# Patient Record
Sex: Male | Born: 1950 | Race: White | Hispanic: No | Marital: Married | Smoking: Former smoker
Health system: Southern US, Community
[De-identification: ages and names within clinical notes are randomized; demographics above are authoritative.]

## PROBLEM LIST (undated history)

## (undated) DIAGNOSIS — I219 Acute myocardial infarction, unspecified: Secondary | ICD-10-CM

## (undated) HISTORY — DX: Acute myocardial infarction, unspecified: I21.9

---

## 2013-09-24 ENCOUNTER — Encounter (HOSPITAL_BASED_OUTPATIENT_CLINIC_OR_DEPARTMENT_OTHER): Payer: Self-pay | Admitting: Emergency Medicine

## 2013-09-24 ENCOUNTER — Inpatient Hospital Stay (HOSPITAL_BASED_OUTPATIENT_CLINIC_OR_DEPARTMENT_OTHER)
Admission: EM | Admit: 2013-09-24 | Discharge: 2013-09-29 | DRG: 234 | Disposition: A | Discharge status: Home / Self Care | Payer: BC Managed Care – PPO | Attending: Thoracic Surgery (Cardiothoracic Vascular Surgery) | Admitting: Thoracic Surgery (Cardiothoracic Vascular Surgery)

## 2013-09-24 DIAGNOSIS — E8779 Other fluid overload: Secondary | ICD-10-CM | POA: Diagnosis not present

## 2013-09-24 DIAGNOSIS — Z8249 Family history of ischemic heart disease and other diseases of the circulatory system: Secondary | ICD-10-CM

## 2013-09-24 DIAGNOSIS — I25709 Atherosclerosis of coronary artery bypass graft(s), unspecified, with unspecified angina pectoris: Secondary | ICD-10-CM | POA: Diagnosis present

## 2013-09-24 DIAGNOSIS — R7309 Other abnormal glucose: Secondary | ICD-10-CM | POA: Diagnosis present

## 2013-09-24 DIAGNOSIS — I214 Non-ST elevation (NSTEMI) myocardial infarction: Principal | ICD-10-CM | POA: Diagnosis present

## 2013-09-24 DIAGNOSIS — I200 Unstable angina: Secondary | ICD-10-CM

## 2013-09-24 DIAGNOSIS — D62 Acute posthemorrhagic anemia: Secondary | ICD-10-CM | POA: Diagnosis not present

## 2013-09-24 DIAGNOSIS — I2510 Atherosclerotic heart disease of native coronary artery without angina pectoris: Secondary | ICD-10-CM | POA: Diagnosis present

## 2013-09-24 LAB — BASIC METABOLIC PANEL
BUN: 19 mg/dL (ref 6–23)
CO2: 25 mEq/L (ref 19–32)
Calcium: 9.4 mg/dL (ref 8.4–10.5)
Chloride: 102 mEq/L (ref 96–112)
Creatinine, Ser: 1 mg/dL (ref 0.50–1.35)
GFR calc Af Amer: >90 mL/min (ref >90)
GFR, EST NON AFRICAN AMERICAN: 78 mL/min — AB (ref >90)
GLUCOSE: 134 mg/dL — AB (ref 70–99)
POTASSIUM: 4 meq/L (ref 3.7–5.3)
Sodium: 141 mEq/L (ref 137–147)

## 2013-09-24 LAB — CBC
HCT: 48.8 % (ref 39.0–52.0)
HEMOGLOBIN: 17.4 g/dL — AB (ref 13.0–17.0)
MCH: 31.8 pg (ref 26.0–34.0)
MCHC: 35.7 g/dL (ref 30.0–36.0)
MCV: 89.2 fL (ref 78.0–100.0)
Platelets: 273 10*3/uL (ref 150–400)
RBC: 5.47 MIL/uL (ref 4.22–5.81)
RDW: 14 % (ref 11.5–15.5)
WBC: 9.4 10*3/uL (ref 4.0–10.5)

## 2013-09-24 LAB — TROPONIN I: Troponin I: <0.3 ng/mL (ref <0.30)

## 2013-09-24 MED ORDER — ASPIRIN 81 MG PO CHEW
324.0000 mg | CHEWABLE_TABLET | Freq: Once | ORAL | Status: AC
  Administered 2013-09-24: 324 mg via ORAL
  Filled 2013-09-24: qty 4

## 2013-09-24 NOTE — ED Provider Notes (Signed)
CSN: [id number]     Arrival date & time 09/24/13  2219 History   First MD Initiated Contact with Patient 09/24/13 2324     Chief Complaint  Patient presents with  . Chest Pain     (Consider location/radiation/quality/duration/timing/severity/associated sxs/prior Treatment) HPI This is a 63 year old male who experienced the sudden onset of chest discomfort at about 10 PM today. The discomfort was located in the vicinity of his Adam's apple and is described as feeling like he had a lump of food in his throat. He was not eating at the time. The discomfort worsened and and was 7/10 at its worst. He has some radiation to the back of his upper arms bilaterally. He became very anxious, had some nausea, dyspnea and clamminess. His symptoms were resolving by the time he arrived in the ED and he is pain-free right now. He has had some similar, but milder, symptoms over the past several days.  History reviewed. No pertinent past medical history. History reviewed. No pertinent past surgical history. No family history on file. History  Substance Use Topics  . Smoking status: Never Smoker   . Smokeless tobacco: Not on file  . Alcohol Use: No    Review of Systems  All other systems reviewed and are negative.    Allergies  Review of patient's allergies indicates no known allergies.  Home Medications  No current outpatient prescriptions on file. BP 167/96  Pulse 89  Temp(Src) 97.7 F (36.5 C) (Oral)  Resp 18  Ht 5\' 10"  (1.778 m)  Wt 190 lb (86.183 kg)  BMI 27.26 kg/m2  SpO2 99%  Physical Exam General: Well-developed, well-nourished male in no acute distress; appearance consistent with age of record HENT: normocephalic; atraumatic Eyes: pupils equal, round and reactive to light; extraocular muscles intact Neck: supple Heart: regular rate and rhythm; no murmurs, rubs or gallops Lungs: clear to auscultation bilaterally Abdomen: soft; nondistended; nontender; bowel sounds  present Extremities: No deformity; full range of motion; pulses normal Neurologic: Awake, alert and oriented; motor function intact in all extremities and symmetric; no facial droop Skin: Warm and dry Psychiatric: Normal mood and affect    ED Course  Procedures (including critical care time)    MDM  EKG Interpretation: (on arrival, pain still present)  Date & Time: 09/24/2013 10:28 PM  Rate: 85  Rhythm: normal sinus rhythm  QRS Axis: normal  Intervals: normal  ST/T Wave abnormalities: ST elevations laterally and ST depressions inferiorly  Conduction Disutrbances:none  Narrative Interpretation:   Old EKG Reviewed: none available  EKG Interpretation: (pain-free)  Date & Time: 09/24/2013 11:39 PM  Rate: 90  Rhythm: normal sinus rhythm  QRS Axis: normal  Intervals: normal  ST/T Wave abnormalities: normal  Conduction Disutrbances:none  Narrative Interpretation:   Old EKG Reviewed: ST depressions have resolved     Nursing notes and vitals signs, including pulse oximetry, reviewed.  Summary of this visit's results, reviewed by myself:  Labs:  Results for orders placed during the hospital encounter of 09/24/13 (from the past 24 hour(s))  CBC     Status: Abnormal   Collection Time    09/24/13 10:44 PM      Result Value Ref Range   WBC 9.4  4.0 - 10.5 K/uL   RBC 5.47  4.22 - 5.81 MIL/uL   Hemoglobin 17.4 (*) 13.0 - 17.0 g/dL   HCT 48.8  39.0 - 52.0 %   MCV 89.2  78.0 - 100.0 fL   MCH 31.8  26.0 -  34.0 pg   MCHC 35.7  30.0 - 36.0 g/dL   RDW 14.0  11.5 - 15.5 %   Platelets 273  150 - 400 K/uL  BASIC METABOLIC PANEL     Status: Abnormal   Collection Time    09/24/13 10:44 PM      Result Value Ref Range   Sodium 141  137 - 147 mEq/L   Potassium 4.0  3.7 - 5.3 mEq/L   Chloride 102  96 - 112 mEq/L   CO2 25  19 - 32 mEq/L   Glucose, Bld 134 (*) 70 - 99 mg/dL   BUN 19  6 - 23 mg/dL   Creatinine, Ser 1.00  0.50 - 1.35 mg/dL   Calcium 9.4  8.4 - 10.5 mg/dL   GFR calc  non Af Amer 78 (*) >90 mL/min   GFR calc Af Amer >90  >90 mL/min  TROPONIN I     Status: None   Collection Time    09/24/13 10:44 PM      Result Value Ref Range   Troponin I <0.30  <0.30 ng/mL   Dr. Doylene Canard accepts patient for transfer to [facility]. He requests unfractionated heparin be initiated prior to transport.     Wynetta Fines, MD 09/25/13 [phone]

## 2013-09-24 NOTE — ED Notes (Signed)
Chest pain for 10 minutes. Had the same type feeling 3 days ago. Pale, sob, and diaphoretic at time of onset.

## 2013-09-25 ENCOUNTER — Encounter (HOSPITAL_COMMUNITY): Payer: Self-pay | Admitting: *Deleted

## 2013-09-25 ENCOUNTER — Encounter (HOSPITAL_COMMUNITY): Payer: BC Managed Care – PPO | Admitting: Certified Registered"

## 2013-09-25 ENCOUNTER — Inpatient Hospital Stay (HOSPITAL_COMMUNITY): Payer: BC Managed Care – PPO | Admitting: Certified Registered"

## 2013-09-25 ENCOUNTER — Inpatient Hospital Stay (HOSPITAL_COMMUNITY): Payer: BC Managed Care – PPO

## 2013-09-25 ENCOUNTER — Encounter (HOSPITAL_COMMUNITY)
Admission: EM | Disposition: A | Discharge status: Home / Self Care | Payer: BC Managed Care – PPO | Source: Home / Self Care | Attending: Thoracic Surgery (Cardiothoracic Vascular Surgery)

## 2013-09-25 DIAGNOSIS — I25709 Atherosclerosis of coronary artery bypass graft(s), unspecified, with unspecified angina pectoris: Secondary | ICD-10-CM | POA: Diagnosis present

## 2013-09-25 DIAGNOSIS — R079 Chest pain, unspecified: Secondary | ICD-10-CM

## 2013-09-25 DIAGNOSIS — I2510 Atherosclerotic heart disease of native coronary artery without angina pectoris: Secondary | ICD-10-CM

## 2013-09-25 HISTORY — PX: INTRAOPERATIVE TRANSESOPHAGEAL ECHOCARDIOGRAM: SHX5062

## 2013-09-25 HISTORY — PX: LEFT HEART CATHETERIZATION WITH CORONARY ANGIOGRAM: SHX5451

## 2013-09-25 HISTORY — PX: CORONARY ARTERY BYPASS GRAFT: SHX141

## 2013-09-25 LAB — LIPID PANEL
CHOLESTEROL: 223 mg/dL — AB (ref 0–200)
HDL: 43 mg/dL (ref >39)
LDL Cholesterol: 157 mg/dL — ABNORMAL HIGH (ref 0–99)
TRIGLYCERIDES: 115 mg/dL (ref <150)
Total CHOL/HDL Ratio: 5.2 RATIO
VLDL: 23 mg/dL (ref 0–40)

## 2013-09-25 LAB — POCT I-STAT 3, ART BLOOD GAS (G3+)
Acid-base deficit: 1 mmol/L (ref 0.0–2.0)
Acid-base deficit: 1 mmol/L (ref 0.0–2.0)
BICARBONATE: 23.8 meq/L (ref 20.0–24.0)
Bicarbonate: 22.8 mEq/L (ref 20.0–24.0)
O2 Saturation: 100 %
O2 Saturation: 95 %
PH ART: 7.412 (ref 7.350–7.450)
PH ART: 7.434 (ref 7.350–7.450)
TCO2: 25 mmol/L (ref 0–100)
TCO2: 24 mmol/L (ref 0–100)
pCO2 arterial: 37.4 mmHg (ref 35.0–45.0)
pCO2 arterial: 33.5 mmHg — ABNORMAL LOW (ref 35.0–45.0)
pO2, Arterial: 258 mmHg — ABNORMAL HIGH (ref 80.0–100.0)
pO2, Arterial: 65 mmHg — ABNORMAL LOW (ref 80.0–100.0)

## 2013-09-25 LAB — TROPONIN I: TROPONIN I: 1.32 ng/mL — AB (ref <0.30)

## 2013-09-25 LAB — CBC
HCT: 39.8 % (ref 39.0–52.0)
HEMOGLOBIN: 14.3 g/dL (ref 13.0–17.0)
MCH: 31.8 pg (ref 26.0–34.0)
MCHC: 35.9 g/dL (ref 30.0–36.0)
MCV: 88.4 fL (ref 78.0–100.0)
Platelets: 163 10*3/uL (ref 150–400)
RBC: 4.5 MIL/uL (ref 4.22–5.81)
RDW: 13.7 % (ref 11.5–15.5)
WBC: 21.2 10*3/uL — ABNORMAL HIGH (ref 4.0–10.5)

## 2013-09-25 LAB — POCT I-STAT 4, (NA,K, GLUC, HGB,HCT)
GLUCOSE: 108 mg/dL — AB (ref 70–99)
GLUCOSE: 109 mg/dL — AB (ref 70–99)
GLUCOSE: 114 mg/dL — AB (ref 70–99)
Glucose, Bld: 110 mg/dL — ABNORMAL HIGH (ref 70–99)
Glucose, Bld: 125 mg/dL — ABNORMAL HIGH (ref 70–99)
Glucose, Bld: 133 mg/dL — ABNORMAL HIGH (ref 70–99)
HCT: 30 % — ABNORMAL LOW (ref 39.0–52.0)
HCT: 39 % (ref 39.0–52.0)
HCT: 29 % — ABNORMAL LOW (ref 39.0–52.0)
HCT: 40 % (ref 39.0–52.0)
HEMATOCRIT: 42 % (ref 39.0–52.0)
HEMATOCRIT: 31 % — AB (ref 39.0–52.0)
HEMOGLOBIN: 10.2 g/dL — AB (ref 13.0–17.0)
HEMOGLOBIN: 13.6 g/dL (ref 13.0–17.0)
Hemoglobin: 14.3 g/dL (ref 13.0–17.0)
Hemoglobin: 13.3 g/dL (ref 13.0–17.0)
Hemoglobin: 10.5 g/dL — ABNORMAL LOW (ref 13.0–17.0)
Hemoglobin: 9.9 g/dL — ABNORMAL LOW (ref 13.0–17.0)
POTASSIUM: 3.7 meq/L (ref 3.7–5.3)
Potassium: 3.9 mEq/L (ref 3.7–5.3)
Potassium: 3.6 mEq/L — ABNORMAL LOW (ref 3.7–5.3)
Potassium: 4.1 mEq/L (ref 3.7–5.3)
Potassium: 4.8 mEq/L (ref 3.7–5.3)
Potassium: 3.9 mEq/L (ref 3.7–5.3)
SODIUM: 140 meq/L (ref 137–147)
SODIUM: 139 meq/L (ref 137–147)
SODIUM: 139 meq/L (ref 137–147)
Sodium: 135 mEq/L — ABNORMAL LOW (ref 137–147)
Sodium: 141 mEq/L (ref 137–147)
Sodium: 138 mEq/L (ref 137–147)

## 2013-09-25 LAB — HEMOGLOBIN AND HEMATOCRIT, BLOOD
HCT: 31.2 % — ABNORMAL LOW (ref 39.0–52.0)
Hemoglobin: 11.4 g/dL — ABNORMAL LOW (ref 13.0–17.0)

## 2013-09-25 LAB — CBC WITH DIFFERENTIAL/PLATELET
Basophils Absolute: 0.1 10*3/uL (ref 0.0–0.1)
Basophils Relative: 1 % (ref 0–1)
EOS ABS: 0.2 10*3/uL (ref 0.0–0.7)
Eosinophils Relative: 2 % (ref 0–5)
HCT: 46.6 % (ref 39.0–52.0)
Hemoglobin: 16.7 g/dL (ref 13.0–17.0)
LYMPHS ABS: 2.4 10*3/uL (ref 0.7–4.0)
LYMPHS PCT: 24 % (ref 12–46)
MCH: 31.9 pg (ref 26.0–34.0)
MCHC: 35.8 g/dL (ref 30.0–36.0)
MCV: 88.9 fL (ref 78.0–100.0)
Monocytes Absolute: 0.8 10*3/uL (ref 0.1–1.0)
Monocytes Relative: 8 % (ref 3–12)
Neutro Abs: 6.5 10*3/uL (ref 1.7–7.7)
Neutrophils Relative %: 65 % (ref 43–77)
PLATELETS: 248 10*3/uL (ref 150–400)
RBC: 5.24 MIL/uL (ref 4.22–5.81)
RDW: 13.8 % (ref 11.5–15.5)
WBC: 10 10*3/uL (ref 4.0–10.5)

## 2013-09-25 LAB — PROTIME-INR
INR: 0.96 (ref 0.00–1.49)
INR: 1.27 (ref 0.00–1.49)
PROTHROMBIN TIME: 12.6 s (ref 11.6–15.2)
Prothrombin Time: 15.6 seconds — ABNORMAL HIGH (ref 11.6–15.2)

## 2013-09-25 LAB — SURGICAL PCR SCREEN
MRSA, PCR: NEGATIVE
STAPHYLOCOCCUS AUREUS: NEGATIVE

## 2013-09-25 LAB — BASIC METABOLIC PANEL
BUN: 17 mg/dL (ref 6–23)
CHLORIDE: 101 meq/L (ref 96–112)
CO2: 26 mEq/L (ref 19–32)
CREATININE: 0.82 mg/dL (ref 0.50–1.35)
Calcium: 8.9 mg/dL (ref 8.4–10.5)
GFR calc non Af Amer: >90 mL/min (ref >90)
GLUCOSE: 130 mg/dL — AB (ref 70–99)
Potassium: 4.3 mEq/L (ref 3.7–5.3)
Sodium: 139 mEq/L (ref 137–147)

## 2013-09-25 LAB — PLATELET COUNT: PLATELETS: 177 10*3/uL (ref 150–400)

## 2013-09-25 LAB — TYPE AND SCREEN
ABO/RH(D): O POS
Antibody Screen: NEGATIVE

## 2013-09-25 LAB — APTT
APTT: 31 s (ref 24–37)
aPTT: 28 seconds (ref 24–37)

## 2013-09-25 LAB — ABO/RH: ABO/RH(D): O POS

## 2013-09-25 SURGERY — CORONARY ARTERY BYPASS GRAFTING (CABG)
Anesthesia: General | Site: Chest

## 2013-09-25 SURGERY — LEFT HEART CATHETERIZATION WITH CORONARY ANGIOGRAM
Anesthesia: LOCAL

## 2013-09-25 MED ORDER — MIDAZOLAM HCL 2 MG/2ML IJ SOLN
2.0000 mg | INTRAMUSCULAR | Status: DC | PRN
Start: 2013-09-25 — End: 2013-09-27
  Administered 2013-09-25: 2 mg via INTRAVENOUS
  Filled 2013-09-25: qty 2

## 2013-09-25 MED ORDER — FAMOTIDINE IN NACL 20-0.9 MG/50ML-% IV SOLN
20.0000 mg | Freq: Two times a day (BID) | INTRAVENOUS | Status: AC
  Administered 2013-09-25: 20 mg via INTRAVENOUS

## 2013-09-25 MED ORDER — NITROGLYCERIN IN D5W 200-5 MCG/ML-% IV SOLN
INTRAVENOUS | Status: AC
  Administered 2013-09-25: 10 ug/min via INTRAVENOUS
  Filled 2013-09-25: qty 250

## 2013-09-25 MED ORDER — MIDAZOLAM HCL 2 MG/2ML IJ SOLN
INTRAMUSCULAR | Status: AC
  Administered 2013-09-25: 2 mg via INTRAVENOUS
  Filled 2013-09-25: qty 2

## 2013-09-25 MED ORDER — SODIUM CHLORIDE 0.9 % IV SOLN
INTRAVENOUS | Status: DC
  Administered 2013-09-25 – 2013-09-26 (×2): 10 mL/h via INTRAVENOUS

## 2013-09-25 MED ORDER — NITROGLYCERIN 0.4 MG SL SUBL: 0.4000 mg | SUBLINGUAL_TABLET | SUBLINGUAL | Status: DC | PRN

## 2013-09-25 MED ORDER — METOPROLOL TARTRATE 25 MG/10 ML ORAL SUSPENSION
12.5000 mg | Freq: Two times a day (BID) | ORAL | Status: DC
  Filled 2013-09-25 (×3): qty 5

## 2013-09-25 MED ORDER — ATORVASTATIN CALCIUM 40 MG PO TABS
40.0000 mg | ORAL_TABLET | Freq: Every day | ORAL | Status: DC
  Filled 2013-09-25: qty 1

## 2013-09-25 MED ORDER — SODIUM CHLORIDE 0.9 % IJ SOLN: 3.0000 mL | Freq: Two times a day (BID) | INTRAMUSCULAR | Status: DC

## 2013-09-25 MED ORDER — ONDANSETRON HCL 4 MG/2ML IJ SOLN
INTRAMUSCULAR | Status: AC
  Filled 2013-09-25: qty 2

## 2013-09-25 MED ORDER — ACETAMINOPHEN 160 MG/5ML PO SOLN
1000.0000 mg | Freq: Four times a day (QID) | ORAL | Status: DC
  Administered 2013-09-25: 1000 mg
  Filled 2013-09-25: qty 40.6

## 2013-09-25 MED ORDER — ATORVASTATIN CALCIUM 80 MG PO TABS: 80.0000 mg | ORAL_TABLET | Freq: Every day | ORAL | Status: DC

## 2013-09-25 MED ORDER — CHLORHEXIDINE GLUCONATE CLOTH 2 % EX PADS: 6.0000 | MEDICATED_PAD | Freq: Once | CUTANEOUS | Status: DC

## 2013-09-25 MED ORDER — HEPARIN BOLUS VIA INFUSION
4000.0000 [IU] | Freq: Once | INTRAVENOUS | Status: AC
Start: 2013-09-25 — End: 2013-09-25
  Administered 2013-09-25: 4000 [IU] via INTRAVENOUS

## 2013-09-25 MED ORDER — DEXTROSE 5 % IV SOLN
1.5000 g | INTRAVENOUS | Status: DC
  Filled 2013-09-25: qty 1.5

## 2013-09-25 MED ORDER — SODIUM CHLORIDE 0.9 % IV SOLN
INTRAVENOUS | Status: DC
  Administered 2013-09-25: 03:00:00 via INTRAVENOUS

## 2013-09-25 MED ORDER — METOPROLOL TARTRATE 25 MG PO TABS
25.0000 mg | ORAL_TABLET | Freq: Two times a day (BID) | ORAL | Status: DC
  Administered 2013-09-25: 25 mg via ORAL
  Filled 2013-09-25 (×3): qty 1

## 2013-09-25 MED ORDER — ASPIRIN EC 81 MG PO TBEC: 81.0000 mg | DELAYED_RELEASE_TABLET | Freq: Every day | ORAL | Status: DC

## 2013-09-25 MED ORDER — FENTANYL CITRATE 0.05 MG/ML IJ SOLN
INTRAMUSCULAR | Status: AC
  Filled 2013-09-25: qty 5

## 2013-09-25 MED ORDER — SODIUM CHLORIDE 0.9 % IV SOLN
10.0000 g | INTRAVENOUS | Status: DC | PRN
  Administered 2013-09-25: 5 g/h via INTRAVENOUS

## 2013-09-25 MED ORDER — PHENYLEPHRINE HCL 10 MG/ML IJ SOLN
30.0000 ug/min | INTRAVENOUS | Status: DC
  Filled 2013-09-25: qty 2

## 2013-09-25 MED ORDER — ASPIRIN 81 MG PO CHEW
324.0000 mg | CHEWABLE_TABLET | Freq: Every day | ORAL | Status: DC
  Administered 2013-09-29: 324 mg
  Filled 2013-09-25 (×2): qty 4

## 2013-09-25 MED ORDER — CLOPIDOGREL BISULFATE 75 MG PO TABS
75.0000 mg | ORAL_TABLET | Freq: Every day | ORAL | Status: DC
  Administered 2013-09-25: 75 mg via ORAL
  Filled 2013-09-25 (×2): qty 1

## 2013-09-25 MED ORDER — LACTATED RINGERS IV SOLN
INTRAVENOUS | Status: DC
  Administered 2013-09-25: 20 mL/h via INTRAVENOUS
  Administered 2013-09-26: 13:00:00 via INTRAVENOUS

## 2013-09-25 MED ORDER — DEXTROSE 5 % IV SOLN
1.5000 g | INTRAVENOUS | Status: DC | PRN
  Administered 2013-09-25: .75 g via INTRAVENOUS
  Administered 2013-09-25: 1.5 g via INTRAVENOUS

## 2013-09-25 MED ORDER — PHENYLEPHRINE HCL 10 MG/ML IJ SOLN
INTRAMUSCULAR | Status: AC
  Filled 2013-09-25: qty 1

## 2013-09-25 MED ORDER — ACETAMINOPHEN 650 MG RE SUPP
650.0000 mg | Freq: Once | RECTAL | Status: AC
  Administered 2013-09-25: 650 mg via RECTAL

## 2013-09-25 MED ORDER — PROTAMINE SULFATE 10 MG/ML IV SOLN
INTRAVENOUS | Status: AC
  Filled 2013-09-25: qty 5

## 2013-09-25 MED ORDER — SODIUM CHLORIDE 0.9 % IV SOLN
INTRAVENOUS | Status: DC
  Administered 2013-09-25: 2.2 [IU]/h via INTRAVENOUS
  Filled 2013-09-25: qty 1

## 2013-09-25 MED ORDER — PLASMA-LYTE 148 IV SOLN
INTRAVENOUS | Status: DC | PRN
  Administered 2013-09-25: 15:00:00 via INTRAVASCULAR

## 2013-09-25 MED ORDER — LIDOCAINE HCL (PF) 1 % IJ SOLN
INTRAMUSCULAR | Status: AC
  Filled 2013-09-25: qty 30

## 2013-09-25 MED ORDER — ALPRAZOLAM 0.25 MG PO TABS
0.2500 mg | ORAL_TABLET | Freq: Two times a day (BID) | ORAL | Status: DC | PRN
Start: 2013-09-25 — End: 2013-09-25

## 2013-09-25 MED ORDER — MAGNESIUM SULFATE 50 % IJ SOLN
40.0000 meq | INTRAMUSCULAR | Status: DC
  Filled 2013-09-25: qty 10

## 2013-09-25 MED ORDER — ACETAMINOPHEN 500 MG PO TABS
1000.0000 mg | ORAL_TABLET | Freq: Four times a day (QID) | ORAL | Status: DC
  Administered 2013-09-26 – 2013-09-29 (×11): 1000 mg via ORAL
  Filled 2013-09-25 (×17): qty 2

## 2013-09-25 MED ORDER — OXYCODONE HCL 5 MG PO TABS
5.0000 mg | ORAL_TABLET | ORAL | Status: DC | PRN
  Administered 2013-09-26 – 2013-09-27 (×5): 5 mg via ORAL
  Filled 2013-09-25 (×5): qty 1

## 2013-09-25 MED ORDER — PROTAMINE SULFATE 10 MG/ML IV SOLN
INTRAVENOUS | Status: DC | PRN
  Administered 2013-09-25: 60 mg via INTRAVENOUS
  Administered 2013-09-25: 50 mg via INTRAVENOUS
  Administered 2013-09-25: 60 mg via INTRAVENOUS
  Administered 2013-09-25: 50 mg via INTRAVENOUS
  Administered 2013-09-25 (×2): 30 mg via INTRAVENOUS

## 2013-09-25 MED ORDER — SODIUM CHLORIDE 0.9 % IJ SOLN
3.0000 mL | INTRAMUSCULAR | Status: DC | PRN
Start: 2013-09-25 — End: 2013-09-25

## 2013-09-25 MED ORDER — DEXMEDETOMIDINE HCL IN NACL 200 MCG/50ML IV SOLN
0.1000 ug/kg/h | INTRAVENOUS | Status: DC
Start: 2013-09-25 — End: 2013-09-27
  Administered 2013-09-25: 0.7 ug/kg/h via INTRAVENOUS
  Filled 2013-09-25 (×2): qty 50

## 2013-09-25 MED ORDER — SIMVASTATIN 20 MG PO TABS
20.0000 mg | ORAL_TABLET | Freq: Every day | ORAL | Status: DC
  Administered 2013-09-26 – 2013-09-29 (×4): 20 mg via ORAL
  Filled 2013-09-25 (×4): qty 1

## 2013-09-25 MED ORDER — VANCOMYCIN HCL 10 G IV SOLR
1250.0000 mg | INTRAVENOUS | Status: AC
  Administered 2013-09-25: 1250 mg via INTRAVENOUS
  Filled 2013-09-25: qty 1250

## 2013-09-25 MED ORDER — NITROGLYCERIN IN D5W 200-5 MCG/ML-% IV SOLN
0.0000 ug/min | INTRAVENOUS | Status: DC
  Administered 2013-09-25: 10 ug/min via INTRAVENOUS

## 2013-09-25 MED ORDER — HEMOSTATIC AGENTS (NO CHARGE) OPTIME
TOPICAL | Status: DC | PRN
  Administered 2013-09-25: 1 via TOPICAL

## 2013-09-25 MED ORDER — AMINOCAPROIC ACID 250 MG/ML IV SOLN
INTRAVENOUS | Status: DC
  Filled 2013-09-25 (×2): qty 40

## 2013-09-25 MED ORDER — ONDANSETRON HCL 4 MG/2ML IJ SOLN: 4.0000 mg | Freq: Four times a day (QID) | INTRAMUSCULAR | Status: DC | PRN

## 2013-09-25 MED ORDER — SODIUM CHLORIDE 0.9 % IJ SOLN
3.0000 mL | Freq: Two times a day (BID) | INTRAMUSCULAR | Status: DC
  Administered 2013-09-26 – 2013-09-27 (×2): 3 mL via INTRAVENOUS

## 2013-09-25 MED ORDER — ASPIRIN EC 325 MG PO TBEC
325.0000 mg | DELAYED_RELEASE_TABLET | Freq: Every day | ORAL | Status: DC
  Administered 2013-09-26 – 2013-09-28 (×3): 325 mg via ORAL
  Filled 2013-09-25 (×5): qty 1

## 2013-09-25 MED ORDER — NITROGLYCERIN IN D5W 200-5 MCG/ML-% IV SOLN
2.0000 ug/min | INTRAVENOUS | Status: DC
  Administered 2013-09-25: 10 ug/min via INTRAVENOUS
  Filled 2013-09-25: qty 250

## 2013-09-25 MED ORDER — ALBUMIN HUMAN 5 % IV SOLN
250.0000 mL | INTRAVENOUS | Status: AC | PRN
  Administered 2013-09-25 – 2013-09-26 (×3): 250 mL via INTRAVENOUS
  Filled 2013-09-25: qty 250

## 2013-09-25 MED ORDER — DEXTROSE 5 % IV SOLN
1.5000 g | Freq: Two times a day (BID) | INTRAVENOUS | Status: AC
  Administered 2013-09-25 – 2013-09-27 (×4): 1.5 g via INTRAVENOUS
  Filled 2013-09-25 (×4): qty 1.5

## 2013-09-25 MED ORDER — DEXMEDETOMIDINE HCL IN NACL 400 MCG/100ML IV SOLN
0.1000 ug/kg/h | INTRAVENOUS | Status: DC
Start: 2013-09-25 — End: 2013-09-25
  Filled 2013-09-25: qty 100

## 2013-09-25 MED ORDER — NITROGLYCERIN IN D5W 200-5 MCG/ML-% IV SOLN: 2.0000 ug/min | INTRAVENOUS | Status: DC

## 2013-09-25 MED ORDER — PHENYLEPHRINE HCL 10 MG/ML IJ SOLN
30.0000 ug/min | INTRAVENOUS | Status: DC
  Administered 2013-09-25: 5 ug/min via INTRAVENOUS
  Filled 2013-09-25: qty 2

## 2013-09-25 MED ORDER — SODIUM CHLORIDE 0.9 % IV SOLN: 250.0000 mL | INTRAVENOUS | Status: DC | PRN

## 2013-09-25 MED ORDER — INSULIN REGULAR HUMAN 100 UNIT/ML IJ SOLN
100.0000 [IU] | INTRAMUSCULAR | Status: DC | PRN
  Administered 2013-09-25: 1 [IU]/h via INTRAVENOUS

## 2013-09-25 MED ORDER — ONDANSETRON HCL 4 MG/2ML IJ SOLN
4.0000 mg | Freq: Four times a day (QID) | INTRAMUSCULAR | Status: DC | PRN
  Administered 2013-09-25: 4 mg via INTRAVENOUS
  Filled 2013-09-25: qty 2

## 2013-09-25 MED ORDER — POTASSIUM CHLORIDE 10 MEQ/50ML IV SOLN
10.0000 meq | INTRAVENOUS | Status: AC
  Administered 2013-09-25 (×3): 10 meq via INTRAVENOUS

## 2013-09-25 MED ORDER — HEPARIN (PORCINE) IN NACL 2-0.9 UNIT/ML-% IJ SOLN
INTRAMUSCULAR | Status: AC
  Filled 2013-09-25: qty 1000

## 2013-09-25 MED ORDER — MORPHINE SULFATE 2 MG/ML IJ SOLN
1.0000 mg | INTRAMUSCULAR | Status: AC | PRN
  Administered 2013-09-25 – 2013-09-26 (×2): 4 mg via INTRAVENOUS

## 2013-09-25 MED ORDER — HEPARIN (PORCINE) IN NACL 100-0.45 UNIT/ML-% IJ SOLN
1000.0000 [IU]/h | INTRAMUSCULAR | Status: DC
  Administered 2013-09-25: 1000 [IU]/h via INTRAVENOUS
  Filled 2013-09-25: qty 250

## 2013-09-25 MED ORDER — MORPHINE SULFATE 2 MG/ML IJ SOLN
2.0000 mg | INTRAMUSCULAR | Status: DC | PRN
  Administered 2013-09-26: 2 mg via INTRAVENOUS
  Filled 2013-09-25: qty 1
  Filled 2013-09-25 (×2): qty 2
  Filled 2013-09-25: qty 1

## 2013-09-25 MED ORDER — SODIUM CHLORIDE 0.9 % IV SOLN
200.0000 ug | INTRAVENOUS | Status: DC | PRN
  Administered 2013-09-25: 0.3 ug/kg/h via INTRAVENOUS

## 2013-09-25 MED ORDER — LACTATED RINGERS IV SOLN
INTRAVENOUS | Status: DC | PRN
  Administered 2013-09-25: 14:00:00 via INTRAVENOUS

## 2013-09-25 MED ORDER — BISACODYL 5 MG PO TBEC: 5.0000 mg | DELAYED_RELEASE_TABLET | Freq: Once | ORAL | Status: DC

## 2013-09-25 MED ORDER — ARTIFICIAL TEARS OP OINT
TOPICAL_OINTMENT | OPHTHALMIC | Status: DC | PRN
  Administered 2013-09-25: 1 via OPHTHALMIC

## 2013-09-25 MED ORDER — EPINEPHRINE HCL 1 MG/ML IJ SOLN
0.5000 ug/min | INTRAVENOUS | Status: DC
  Filled 2013-09-25: qty 4

## 2013-09-25 MED ORDER — METOPROLOL TARTRATE 1 MG/ML IV SOLN: 2.5000 mg | INTRAVENOUS | Status: DC | PRN

## 2013-09-25 MED ORDER — POTASSIUM CHLORIDE 2 MEQ/ML IV SOLN
80.0000 meq | INTRAVENOUS | Status: DC
  Filled 2013-09-25: qty 40

## 2013-09-25 MED ORDER — GELATIN ABSORBABLE MT POWD
OROMUCOSAL | Status: DC | PRN
  Administered 2013-09-25: 15:00:00 via TOPICAL

## 2013-09-25 MED ORDER — NITROGLYCERIN 0.2 MG/ML ON CALL CATH LAB
INTRAVENOUS | Status: AC
  Filled 2013-09-25: qty 1

## 2013-09-25 MED ORDER — SODIUM CHLORIDE 0.9 % IV SOLN
INTRAVENOUS | Status: DC
  Filled 2013-09-25: qty 30

## 2013-09-25 MED ORDER — METOPROLOL TARTRATE 12.5 MG HALF TABLET: 12.5000 mg | ORAL_TABLET | Freq: Once | ORAL | Status: DC

## 2013-09-25 MED ORDER — PLASMA-LYTE 148 IV SOLN
INTRAVENOUS | Status: DC
  Filled 2013-09-25: qty 2.5

## 2013-09-25 MED ORDER — SODIUM CHLORIDE 0.9 % IV SOLN
1250.0000 mg | INTRAVENOUS | Status: DC
  Filled 2013-09-25: qty 1250

## 2013-09-25 MED ORDER — ACETAMINOPHEN 325 MG PO TABS: 650.0000 mg | ORAL_TABLET | ORAL | Status: DC | PRN

## 2013-09-25 MED ORDER — DOCUSATE SODIUM 100 MG PO CAPS
200.0000 mg | ORAL_CAPSULE | Freq: Every day | ORAL | Status: DC
  Administered 2013-09-26 – 2013-09-28 (×3): 200 mg via ORAL
  Filled 2013-09-25 (×4): qty 2

## 2013-09-25 MED ORDER — MIDAZOLAM HCL 5 MG/5ML IJ SOLN
INTRAMUSCULAR | Status: DC | PRN
  Administered 2013-09-25: 2 mg via INTRAVENOUS
  Administered 2013-09-25: 5 mg via INTRAVENOUS
  Administered 2013-09-25: 3 mg via INTRAVENOUS

## 2013-09-25 MED ORDER — 0.9 % SODIUM CHLORIDE (POUR BTL) OPTIME
TOPICAL | Status: DC | PRN
  Administered 2013-09-25: 1000 mL

## 2013-09-25 MED ORDER — ASPIRIN 300 MG RE SUPP
300.0000 mg | RECTAL | Status: AC
  Filled 2013-09-25: qty 1

## 2013-09-25 MED ORDER — PANTOPRAZOLE SODIUM 40 MG PO TBEC
40.0000 mg | DELAYED_RELEASE_TABLET | Freq: Every day | ORAL | Status: DC
Start: 2013-09-27 — End: 2013-09-29
  Administered 2013-09-27 – 2013-09-29 (×3): 40 mg via ORAL
  Filled 2013-09-25 (×3): qty 1

## 2013-09-25 MED ORDER — ACETAMINOPHEN 160 MG/5ML PO SOLN: 650.0000 mg | Freq: Once | ORAL | Status: AC

## 2013-09-25 MED ORDER — PROPOFOL 10 MG/ML IV BOLUS
INTRAVENOUS | Status: DC | PRN
  Administered 2013-09-25: 60 mg via INTRAVENOUS
  Administered 2013-09-25: 70 mg via INTRAVENOUS

## 2013-09-25 MED ORDER — SODIUM CHLORIDE 0.9 % IV SOLN
INTRAVENOUS | Status: DC
  Administered 2013-09-25: 600 mL via INTRAVENOUS

## 2013-09-25 MED ORDER — FENTANYL CITRATE 0.05 MG/ML IJ SOLN
INTRAMUSCULAR | Status: AC
  Filled 2013-09-25: qty 2

## 2013-09-25 MED ORDER — HEPARIN SODIUM (PORCINE) 1000 UNIT/ML IJ SOLN
INTRAMUSCULAR | Status: DC | PRN
Start: 2013-09-25 — End: 2013-09-25
  Administered 2013-09-25: 6 mL via INTRAVENOUS
  Administered 2013-09-25: 10 mL via INTRAVENOUS
  Administered 2013-09-25: 2 mL via INTRAVENOUS
  Administered 2013-09-25: 10 mL via INTRAVENOUS
  Administered 2013-09-25: 5 mL via INTRAVENOUS

## 2013-09-25 MED ORDER — DEXTROSE 5 % IV SOLN
750.0000 mg | INTRAVENOUS | Status: DC
  Filled 2013-09-25: qty 750

## 2013-09-25 MED ORDER — PHENYLEPHRINE HCL 10 MG/ML IJ SOLN
0.0000 ug/min | INTRAMUSCULAR | Status: DC
  Administered 2013-09-25: 5 ug/min via INTRAVENOUS
  Filled 2013-09-25: qty 2

## 2013-09-25 MED ORDER — LACTATED RINGERS IV SOLN: 500.0000 mL | Freq: Once | INTRAVENOUS | Status: AC | PRN

## 2013-09-25 MED ORDER — ROCURONIUM BROMIDE 50 MG/5ML IV SOLN
INTRAVENOUS | Status: AC
  Filled 2013-09-25: qty 2

## 2013-09-25 MED ORDER — DIAZEPAM 5 MG PO TABS: 5.0000 mg | ORAL_TABLET | Freq: Once | ORAL | Status: DC

## 2013-09-25 MED ORDER — INSULIN REGULAR BOLUS VIA INFUSION
0.0000 [IU] | Freq: Three times a day (TID) | INTRAVENOUS | Status: DC
  Administered 2013-09-26: 2.4 [IU] via INTRAVENOUS
  Filled 2013-09-25: qty 10

## 2013-09-25 MED ORDER — HEPARIN SODIUM (PORCINE) 1000 UNIT/ML IJ SOLN
INTRAMUSCULAR | Status: AC
  Filled 2013-09-25: qty 3

## 2013-09-25 MED ORDER — MIDAZOLAM HCL 10 MG/2ML IJ SOLN
INTRAMUSCULAR | Status: AC
  Filled 2013-09-25: qty 2

## 2013-09-25 MED ORDER — TEMAZEPAM 15 MG PO CAPS: 15.0000 mg | ORAL_CAPSULE | Freq: Once | ORAL | Status: DC | PRN

## 2013-09-25 MED ORDER — METOPROLOL TARTRATE 12.5 MG HALF TABLET
12.5000 mg | ORAL_TABLET | Freq: Two times a day (BID) | ORAL | Status: DC
  Filled 2013-09-25 (×3): qty 1

## 2013-09-25 MED ORDER — DOPAMINE-DEXTROSE 3.2-5 MG/ML-% IV SOLN
2.0000 ug/kg/min | INTRAVENOUS | Status: DC
  Filled 2013-09-25: qty 250

## 2013-09-25 MED ORDER — BISACODYL 10 MG RE SUPP: 10.0000 mg | Freq: Every day | RECTAL | Status: DC

## 2013-09-25 MED ORDER — SODIUM CHLORIDE 0.45 % IV SOLN
INTRAVENOUS | Status: DC
  Administered 2013-09-25: 20 mL/h via INTRAVENOUS

## 2013-09-25 MED ORDER — SODIUM CHLORIDE 0.9 % IJ SOLN: 3.0000 mL | INTRAMUSCULAR | Status: DC | PRN

## 2013-09-25 MED ORDER — LIDOCAINE HCL (CARDIAC) 20 MG/ML IV SOLN
INTRAVENOUS | Status: AC
  Filled 2013-09-25: qty 5

## 2013-09-25 MED ORDER — PROPOFOL 10 MG/ML IV BOLUS
INTRAVENOUS | Status: AC
  Filled 2013-09-25: qty 20

## 2013-09-25 MED ORDER — FENTANYL CITRATE 0.05 MG/ML IJ SOLN
INTRAMUSCULAR | Status: DC | PRN
  Administered 2013-09-25: 250 ug via INTRAVENOUS
  Administered 2013-09-25: 50 ug via INTRAVENOUS
  Administered 2013-09-25 (×4): 250 ug via INTRAVENOUS

## 2013-09-25 MED ORDER — NITROGLYCERIN IN D5W 200-5 MCG/ML-% IV SOLN
INTRAVENOUS | Status: DC | PRN
  Administered 2013-09-25: 10 ug/min via INTRAVENOUS

## 2013-09-25 MED ORDER — ROCURONIUM BROMIDE 50 MG/5ML IV SOLN
INTRAVENOUS | Status: AC
  Filled 2013-09-25: qty 1

## 2013-09-25 MED ORDER — SODIUM CHLORIDE 0.9 % IV SOLN: 250.0000 mL | INTRAVENOUS | Status: DC

## 2013-09-25 MED ORDER — ASPIRIN 81 MG PO CHEW
324.0000 mg | CHEWABLE_TABLET | ORAL | Status: AC
  Filled 2013-09-25: qty 4

## 2013-09-25 MED ORDER — BISACODYL 5 MG PO TBEC
10.0000 mg | DELAYED_RELEASE_TABLET | Freq: Every day | ORAL | Status: DC
  Administered 2013-09-28: 10 mg via ORAL
  Filled 2013-09-25: qty 2

## 2013-09-25 MED ORDER — PROTAMINE SULFATE 10 MG/ML IV SOLN
INTRAVENOUS | Status: AC
  Filled 2013-09-25: qty 25

## 2013-09-25 MED ORDER — LACTATED RINGERS IV SOLN
INTRAVENOUS | Status: DC | PRN
  Administered 2013-09-25 (×2): via INTRAVENOUS

## 2013-09-25 MED ORDER — INSULIN REGULAR HUMAN 100 UNIT/ML IJ SOLN
INTRAMUSCULAR | Status: DC
  Filled 2013-09-25: qty 1

## 2013-09-25 MED ORDER — MAGNESIUM SULFATE 4000MG/100ML IJ SOLN
4.0000 g | Freq: Once | INTRAMUSCULAR | Status: AC
  Administered 2013-09-25: 4 g via INTRAVENOUS
  Filled 2013-09-25: qty 100

## 2013-09-25 MED ORDER — VANCOMYCIN HCL IN DEXTROSE 1-5 GM/200ML-% IV SOLN
1000.0000 mg | Freq: Once | INTRAVENOUS | Status: AC
  Administered 2013-09-26: 1000 mg via INTRAVENOUS
  Filled 2013-09-25: qty 200

## 2013-09-25 MED ORDER — ROCURONIUM BROMIDE 100 MG/10ML IV SOLN
INTRAVENOUS | Status: DC | PRN
  Administered 2013-09-25 (×2): 50 mg via INTRAVENOUS
  Administered 2013-09-25: 70 mg via INTRAVENOUS
  Administered 2013-09-25: 30 mg via INTRAVENOUS

## 2013-09-25 SURGICAL SUPPLY — 96 items
ATTRACTOMAT 16X20 MAGNETIC DRP (DRAPES) ×3 IMPLANT
BAG DECANTER FOR FLEXI CONT (MISCELLANEOUS) ×3 IMPLANT
BANDAGE ELASTIC 4 VELCRO ST LF (GAUZE/BANDAGES/DRESSINGS) ×3 IMPLANT
BANDAGE ELASTIC 6 VELCRO ST LF (GAUZE/BANDAGES/DRESSINGS) ×3 IMPLANT
BANDAGE GAUZE ELAST BULKY 4 IN (GAUZE/BANDAGES/DRESSINGS) ×3 IMPLANT
BASKET HEART (ORDER IN 25'S) (MISCELLANEOUS) ×1
BASKET HEART (ORDER IN 25S) (MISCELLANEOUS) ×2 IMPLANT
BLADE STERNUM SYSTEM 6 (BLADE) ×3 IMPLANT
BLADE SURG 11 STRL SS (BLADE) ×3 IMPLANT
CANISTER SUCTION 2500CC (MISCELLANEOUS) ×3 IMPLANT
CANNULA EZ GLIDE AORTIC 21FR (CANNULA) ×3 IMPLANT
CARDIAC SUCTION (MISCELLANEOUS) ×3 IMPLANT
CATH CPB KIT HENDRICKSON (MISCELLANEOUS) ×3 IMPLANT
CATH ROBINSON RED A/P 18FR (CATHETERS) ×3 IMPLANT
CATH THORACIC 36FR (CATHETERS) ×3 IMPLANT
CATH THORACIC 36FR RT ANG (CATHETERS) ×3 IMPLANT
CLIP TI MEDIUM 24 (CLIP) IMPLANT
CLIP TI WIDE RED SMALL 24 (CLIP) ×3 IMPLANT
COVER SURGICAL LIGHT HANDLE (MISCELLANEOUS) ×3 IMPLANT
CRADLE DONUT ADULT HEAD (MISCELLANEOUS) ×3 IMPLANT
DERMABOND ADVANCED (GAUZE/BANDAGES/DRESSINGS) ×1
DERMABOND ADVANCED .7 DNX12 (GAUZE/BANDAGES/DRESSINGS) ×2 IMPLANT
DRAPE CARDIOVASCULAR INCISE (DRAPES) ×1
DRAPE SLUSH/WARMER DISC (DRAPES) ×3 IMPLANT
DRAPE SRG 135X102X78XABS (DRAPES) ×2 IMPLANT
DRSG COVADERM 4X14 (GAUZE/BANDAGES/DRESSINGS) ×3 IMPLANT
ELECT REM PT RETURN 9FT ADLT (ELECTROSURGICAL) ×6
ELECTRODE REM PT RTRN 9FT ADLT (ELECTROSURGICAL) ×4 IMPLANT
GLOVE BIO SURGEON STRL SZ 6 (GLOVE) ×6 IMPLANT
GLOVE BIO SURGEON STRL SZ 6.5 (GLOVE) ×12 IMPLANT
GLOVE BIOGEL PI IND STRL 6 (GLOVE) ×4 IMPLANT
GLOVE BIOGEL PI IND STRL 6.5 (GLOVE) ×8 IMPLANT
GLOVE BIOGEL PI IND STRL 7.0 (GLOVE) ×8 IMPLANT
GLOVE BIOGEL PI INDICATOR 6 (GLOVE) ×2
GLOVE BIOGEL PI INDICATOR 6.5 (GLOVE) ×4
GLOVE BIOGEL PI INDICATOR 7.0 (GLOVE) ×4
GLOVE EUDERMIC 7 POWDERFREE (GLOVE) ×9 IMPLANT
GOWN STRL REUS W/ TWL LRG LVL3 (GOWN DISPOSABLE) ×16 IMPLANT
GOWN STRL REUS W/ TWL XL LVL3 (GOWN DISPOSABLE) ×4 IMPLANT
GOWN STRL REUS W/TWL LRG LVL3 (GOWN DISPOSABLE) ×8
GOWN STRL REUS W/TWL XL LVL3 (GOWN DISPOSABLE) ×2
HEMOSTAT POWDER SURGIFOAM 1G (HEMOSTASIS) ×9 IMPLANT
HEMOSTAT SURGICEL 2X14 (HEMOSTASIS) ×6 IMPLANT
INSERT FOGARTY XLG (MISCELLANEOUS) IMPLANT
KIT BASIN OR (CUSTOM PROCEDURE TRAY) ×3 IMPLANT
KIT ROOM TURNOVER OR (KITS) ×3 IMPLANT
KIT SUCTION CATH 14FR (SUCTIONS) ×9 IMPLANT
KIT VASOVIEW W/TROCAR VH 2000 (KITS) ×3 IMPLANT
MARKER GRAFT CORONARY BYPASS (MISCELLANEOUS) ×9 IMPLANT
NS IRRIG 1000ML POUR BTL (IV SOLUTION) ×15 IMPLANT
PACK OPEN HEART (CUSTOM PROCEDURE TRAY) ×3 IMPLANT
PAD ARMBOARD 7.5X6 YLW CONV (MISCELLANEOUS) ×6 IMPLANT
PAD ELECT DEFIB RADIOL ZOLL (MISCELLANEOUS) ×3 IMPLANT
PENCIL BUTTON HOLSTER BLD 10FT (ELECTRODE) ×3 IMPLANT
PUNCH AORTIC ROTATE  4.5MM 8IN (MISCELLANEOUS) ×3 IMPLANT
PUNCH AORTIC ROTATE 4.0MM (MISCELLANEOUS) IMPLANT
PUNCH AORTIC ROTATE 4.5MM 8IN (MISCELLANEOUS) IMPLANT
PUNCH AORTIC ROTATE 5MM 8IN (MISCELLANEOUS) IMPLANT
SET CARDIOPLEGIA MPS 5001102 (MISCELLANEOUS) ×3 IMPLANT
SPONGE GAUZE 4X4 12PLY (GAUZE/BANDAGES/DRESSINGS) ×6 IMPLANT
STOPCOCK 4 WAY LG BORE MALE ST (IV SETS) ×3 IMPLANT
SUT BONE WAX W31G (SUTURE) ×3 IMPLANT
SUT MNCRL AB 4-0 PS2 18 (SUTURE) IMPLANT
SUT PROLENE 3 0 SH DA (SUTURE) ×3 IMPLANT
SUT PROLENE 4 0 RB 1 (SUTURE) ×1
SUT PROLENE 4 0 SH DA (SUTURE) IMPLANT
SUT PROLENE 4-0 RB1 .5 CRCL 36 (SUTURE) ×2 IMPLANT
SUT PROLENE 6 0 C 1 30 (SUTURE) ×6 IMPLANT
SUT PROLENE 7 0 BV1 MDA (SUTURE) ×3 IMPLANT
SUT PROLENE 8 0 BV175 6 (SUTURE) IMPLANT
SUT STEEL 6MS V (SUTURE) ×3 IMPLANT
SUT STEEL STERNAL CCS#1 18IN (SUTURE) IMPLANT
SUT STEEL SZ 6 DBL 3X14 BALL (SUTURE) ×3 IMPLANT
SUT VIC AB 1 CTX 27 (SUTURE) ×3 IMPLANT
SUT VIC AB 1 CTX 36 (SUTURE) ×2
SUT VIC AB 1 CTX36XBRD ANBCTR (SUTURE) ×4 IMPLANT
SUT VIC AB 2-0 CT1 27 (SUTURE) ×1
SUT VIC AB 2-0 CT1 TAPERPNT 27 (SUTURE) ×2 IMPLANT
SUT VIC AB 2-0 CTX 27 (SUTURE) IMPLANT
SUT VIC AB 3-0 SH 27 (SUTURE)
SUT VIC AB 3-0 SH 27X BRD (SUTURE) IMPLANT
SUT VIC AB 3-0 X1 27 (SUTURE) IMPLANT
SUT VICRYL 4-0 PS2 18IN ABS (SUTURE) ×3 IMPLANT
SUTURE E-PAK OPEN HEART (SUTURE) ×3 IMPLANT
SYSTEM SAHARA CHEST DRAIN ATS (WOUND CARE) ×3 IMPLANT
TAPE CLOTH SURG 4X10 WHT LF (GAUZE/BANDAGES/DRESSINGS) ×3 IMPLANT
TAPE PAPER 2X10 WHT MICROPORE (GAUZE/BANDAGES/DRESSINGS) ×3 IMPLANT
TOWEL OR 17X24 6PK STRL BLUE (TOWEL DISPOSABLE) ×6 IMPLANT
TOWEL OR 17X26 10 PK STRL BLUE (TOWEL DISPOSABLE) ×6 IMPLANT
TRAY CATH LUMEN 1 20CM STRL (SET/KITS/TRAYS/PACK) ×3 IMPLANT
TRAY FOLEY IC TEMP SENS 16FR (CATHETERS) ×3 IMPLANT
TUBE FEEDING 8FR 16IN STR KANG (MISCELLANEOUS) ×3 IMPLANT
TUBING ART PRESS 48 MALE/FEM (TUBING) ×6 IMPLANT
TUBING INSUFFLATION 10FT LAP (TUBING) ×3 IMPLANT
UNDERPAD 30X30 INCONTINENT (UNDERPADS AND DIAPERS) ×3 IMPLANT
WATER STERILE IRR 1000ML POUR (IV SOLUTION) ×6 IMPLANT

## 2013-09-25 NOTE — Anesthesia Preprocedure Evaluation (Signed)
Anesthesia Evaluation Anesthesia Physical Anesthesia Plan  ASA: III and emergent  Anesthesia Plan: General   Post-op Pain Management:    Induction: Intravenous  Airway Management Planned: Oral ETT  Additional Equipment: Arterial line, CVP, PA Cath and 3D TEE  Intra-op Plan:   Post-operative Plan: Post-operative intubation/ventilation  Informed Consent: I have reviewed the patients History and Physical, chart, labs and discussed the procedure including the risks, benefits and alternatives for the proposed anesthesia with the patient or authorized representative who has indicated his/her understanding and acceptance.   Dental advisory given  Plan Discussed with: CRNA, Anesthesiologist and Surgeon  Anesthesia Plan Comments:         Anesthesia Quick Evaluation

## 2013-09-25 NOTE — Progress Notes (Signed)
Chaplain followed up with pt's family.  Family informed chaplain that the surgeon had spoken to them and that the surgery had gone well.  Will follow as needed.   09/25/13 2300  Clinical Encounter Type  Visited With Family;Patient not available  Visit Type Spiritual support;Post-op    Estelle June, chaplain [phone]

## 2013-09-25 NOTE — Transfer of Care (Signed)
Immediate Anesthesia Transfer of Care Note  Patient: Eric Warren  Procedure(s) Performed: Procedure(s) with comments: CORONARY ARTERY BYPASS GRAFTING (CABG) (N/A) - Times 3 using left internal mammary artery and endoscopically harvested right saphenous vein. INTRAOPERATIVE TRANSESOPHAGEAL ECHOCARDIOGRAM (N/A)  Patient Location: SICU  Anesthesia Type:General  Level of Consciousness: Patient remains intubated per anesthesia plan  Airway & Oxygen Therapy: Patient remains intubated per anesthesia plan and Patient placed on Ventilator (see vital sign flow sheet for setting)  Post-op Assessment: Report given to PACU RN and Post -op Vital signs reviewed and stable  Post vital signs: Reviewed and stable  Complications: No apparent anesthesia complications

## 2013-09-25 NOTE — Interval H&P Note (Signed)
History and Physical Interval Note:  09/25/2013 1:22 PM  Eric Warren  has presented today for surgery, with the diagnosis of CAD  The various methods of treatment have been discussed with the patient and family. After consideration of risks, benefits and other options for treatment, the patient has consented to  Procedure(s): CORONARY ARTERY BYPASS GRAFTING (CABG) (N/A) INTRAOPERATIVE TRANSESOPHAGEAL ECHOCARDIOGRAM (N/A) as a surgical intervention .  The patient's history has been reviewed, patient examined, no change in status, stable for surgery.  I have reviewed the patient's chart and labs.  Questions were answered to the patient's satisfaction.     Jolly Bleicher C

## 2013-09-25 NOTE — CV Procedure (Signed)
PROCEDURE:  Left heart catheterization with selective coronary angiography, left ventriculogram and Left subclavian injection.Marland Kitchen  CLINICAL HISTORY:  This is a 63 years old male with NSTEMI had typical chest pain with nausea and sweating spell.  The risks, benefits, and details of the procedure were explained to the patient.  The patient verbalized understanding and wanted to proceed.  Informed written consent was obtained.  PROCEDURE TECHNIQUE:  The patient was approached from the right femoral artery using a 5 French short sheath.  Left coronary angiography was done using a Judkins L4 guide catheter.  Right coronary angiography was done using a Judkins R4 guide catheter.  Left ventriculography was done using a pigtail catheter.    CONTRAST:  Total of 80 cc.  COMPLICATIONS:  None.  At the end of the procedure a manual pressure was used for hemostasis.    HEMODYNAMICS:  Aortic pressure was 135/78; LV pressure was 136/9; LVEDP 16.  There was no gradient between the left ventricle and aorta.    ANGIOGRAM/CORONARY ARTERIOGRAM:   The left main coronary artery is very short.  The left anterior descending artery has 90 % stenosis near origin then ectatic area followed by minimal disease. Large diagonal 1 supplies postero-lateral wall. Diagonal 2 has mild osteal narrowing.  The left circumflex artery has osteal 50-60 % narrowing and mid vessel 50 % narrowing.  The right coronary artery is dominant and has luminal irregularities throughout with 20 % proximal and 30-40 % mid to distal area and 20 % distal stenoses. Posterolateral and PDA were unremarkable  LEFT VENTRICULOGRAM:  Left ventricular angiogram was done in the 30 RAO projection and revealed normal left ventricular wall motion and systolic function with an estimated ejection fraction of 60-70 %.  LVEDP was 16 mmHg.  IMPRESSION OF HEART CATHETERIZATION:   1. Normal left main coronary artery is very short. 2. 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 3. Moderate to severe osteal disease left circumflex artery and small branches. 4. Mild disease of dominant right coronary artery. 5. Normal left ventricular systolic function.  LVEDP 16 mmHg.  Ejection fraction 60-70 %. 6.  Patent LIMA on left subclavian injection.  RECOMMENDATION:   CVTS consult for possible CABG.

## 2013-09-25 NOTE — Addendum Note (Signed)
Addendum created 09/25/13 1924 by Vaughan Browner, CRNA   Modules edited: Anesthesia LDA

## 2013-09-25 NOTE — Progress Notes (Signed)
Chaplain spoke to pt's family who indicated that pt had been in surgery a couple of hours (3:30).  Chaplain delivered compassion and care through presence and conversation.  Chaplain will follow up tonight.     09/25/13 1800  Clinical Encounter Type  Visited With Family  Visit Type Spiritual support;Patient in surgery  Referral From [place], pager [phone]

## 2013-09-25 NOTE — Anesthesia Postprocedure Evaluation (Signed)
  Anesthesia Post-op Note  Patient: Eric Warren  Procedure(s) Performed: Procedure(s) with comments: CORONARY ARTERY BYPASS GRAFTING (CABG) (N/A) - Times 3 using left internal mammary artery and endoscopically harvested right saphenous vein. INTRAOPERATIVE TRANSESOPHAGEAL ECHOCARDIOGRAM (N/A)  Patient Location: ICU  Anesthesia Type:General  Level of Consciousness: sedated and Patient remains intubated per anesthesia plan  Airway and Oxygen Therapy: Patient remains intubated per anesthesia plan  Post-op Pain: none  Post-op Assessment: Post-op Vital signs reviewed, Patient's Cardiovascular Status Stable, Respiratory Function Stable, Patent Airway, No signs of Nausea or vomiting and Pain level controlled  Post-op Vital Signs: Reviewed and stable  Complications: No apparent anesthesia complications

## 2013-09-25 NOTE — Brief Op Note (Addendum)
09/24/2013 - 09/25/2013  5:39 PM  PATIENT:  Eric Warren  63 y.o. male  PRE-OPERATIVE DIAGNOSIS:  1. S/p NSTEMI 2.CAD  POST-OPERATIVE DIAGNOSIS:  1. S/p NSTEMI 2.CAD  PROCEDURE:    INTRAOPERATIVE TRANSESOPHAGEAL ECHOCARDIOGRAM,   URGENT CORONARY ARTERY BYPASS GRAFTING (CABG) x 3   LIMA to LAD   SVG SEQUENTIALLY to DIAGONAL 1 and OM 1   Endoscopic vein harvest right thigh  SURGEON:  Surgeon(s) and Role:    * Melrose Nakayama, MD - Primary  PHYSICIAN ASSISTANT: Lars Pinks PA-C   ANESTHESIA:   general  EBL:  Total I/O In: -  Out: 89 [Urine:615]   DRAINS: Chest tubes placed in the mediastinal and pleural spaces   COUNTS CORRECT:  YES  PLAN OF CARE: Admit to inpatient   PATIENT DISPOSITION:  ICU - intubated and hemodynamically stable.   Delay start of Pharmacological VTE agent (>24hrs) due to surgical blood loss or risk of bleeding: yes  BASELINE WEIGHT: 89 kg  XC= 47 min CPB= 67 min  FINDINGS Good targets and conduits Off pump with no inotropes TEE: good LV function

## 2013-09-25 NOTE — Progress Notes (Signed)
S/p CABG x 3  Intubated, sedated  BP 148/82  Pulse 80  Temp(Src) 98.3 F (36.8 C) (Oral)  Resp 12  Ht 5\' 10"  (1.778 m)  Wt 196 lb 3.4 oz (89 kg)  BMI 28.15 kg/m2  SpO2 97%  25/12 Co= 3.32   Intake/Output Summary (Last 24 hours) at 09/25/13 1931 Last data filed at 09/25/13 1913  Gross per 24 hour  Intake   3075 ml  Output   3425 ml  Net   -350 ml    K= 3.9 Hct 40 by i stat  Doing well early postop- index down as he is dry- will volume resuscitate

## 2013-09-25 NOTE — Progress Notes (Signed)
Admitted pt from [facility] AAOx3. Oriented to room and call bell.heparin drip infusing at 10cc/hr  To rt AC. VS taken and recorded.DR Doylene Canard made aware of admission. Tele on. NSR. Seen by DR Doylene Canard Kept NPO for cath in am as ordered.

## 2013-09-25 NOTE — Consult Note (Signed)
Reason for Consult:CAD post NTEMI Referring Physician: Dr. Dixie Dials  Davarion Warren is an 63 y.o. male.  HPI: 63 yo male presents with a cc/o chest pain  Eric Warren is a 63 yo man with no prior cardiac history. He does not see a doctor regularly but has been told he had borderline cholesterol in the past. He has no history of diabetes or hypertension. He is a life long non-smoker. His father did have a heart attack in his mid 26's.   Mr. Eric Warren presented to the ED last night with a cc/o CP. Around 10 PM he had abrupt onset of a sensation of something being stuck in his throat.He became diaphoretic and nauseated. The pain radiated to the back of his shoulders and arms. He became very anxious. The pain resolved when he got to the ED. He was admitted and ruled in for MI with a troponin of 1.32  Today he underwent cardiac cath which showed a very short left main, almost a common ostium between the LAD and circumflex. The LAD had an ostial 90-95% stenosis. The circumflex was small but had a 50% ostial lesion. There was moderate, non- hemodynamically significant disease in the RCA. He had anterior hypokinesis. He is currently pain free. He was given one dose of plavix prior to cath.  History reviewed. No pertinent past medical history.- see HPI  History reviewed. No pertinent past surgical history.  Family History  Problem Relation Age of Onset  . Coronary artery disease Father 84    had a stent    Social History:  reports that he has never smoked. He does not have any smokeless tobacco history on file. He reports that he does not drink alcohol or use illicit drugs.  Allergies: No Known Allergies  Medications:  Prior to Admission:  No prescriptions prior to admission   Scheduled: . aminocaproic acid (AMICAR) for OHS   Intravenous To OR  . [facility] HOLD] aspirin EC  81 mg Oral Daily  . cefUROXime (ZINACEF)  IV  1.5 g Intravenous To OR  . cefUROXime (ZINACEF)  IV  750 mg Intravenous To OR   . dexmedetomidine  0.1-0.7 mcg/kg/hr Intravenous To OR  . DOPamine  2-20 mcg/kg/min Intravenous To OR  . epinephrine  0.5-20 mcg/min Intravenous To OR  . heparin-papaverine-plasmalyte irrigation   Irrigation To OR  . heparin 30,000 units/NS 1000 mL solution for CELLSAVER   Other To OR  . insulin (NOVOLIN-R) infusion   Intravenous To OR  . magnesium sulfate  40 mEq Other To OR  . [facility] HOLD] metoprolol tartrate  25 mg Oral BID  . nitroGLYCERIN  2-200 mcg/min Intravenous To OR  . phenylephrine (NEO-SYNEPHRINE) Adult infusion  30-200 mcg/min Intravenous To OR  . potassium chloride  80 mEq Other To OR  . sodium chloride  3 mL Intravenous Q12H  . vancomycin  1,250 mg Intravenous To OR    Results for orders placed during the hospital encounter of 09/24/13 (from the past 48 hour(s))  CBC     Status: Abnormal   Collection Time    09/24/13 10:44 PM      Result Value Ref Range   WBC 9.4  4.0 - 10.5 K/uL   RBC 5.47  4.22 - 5.81 MIL/uL   Hemoglobin 17.4 (*) 13.0 - 17.0 g/dL   HCT 48.8  39.0 - 52.0 %   MCV 89.2  78.0 - 100.0 fL   MCH 31.8  26.0 - 34.0 pg   MCHC 35.7  30.0 - 36.0 g/dL   RDW 14.0  11.5 - 15.5 %   Platelets 273  150 - 400 K/uL  BASIC METABOLIC PANEL     Status: Abnormal   Collection Time    09/24/13 10:44 PM      Result Value Ref Range   Sodium 141  137 - 147 mEq/L   Potassium 4.0  3.7 - 5.3 mEq/L   Chloride 102  96 - 112 mEq/L   CO2 25  19 - 32 mEq/L   Glucose, Bld 134 (*) 70 - 99 mg/dL   BUN 19  6 - 23 mg/dL   Creatinine, Ser 1.00  0.50 - 1.35 mg/dL   Calcium 9.4  8.4 - 10.5 mg/dL   GFR calc non Af Amer 78 (*) >90 mL/min   GFR calc Af Amer >90  >90 mL/min   Comment: (NOTE)     The eGFR has been calculated using the CKD EPI equation.     This calculation has not been validated in all clinical situations.     eGFR's persistently <90 mL/min signify possible Chronic Kidney     Disease.  TROPONIN I     Status: None   Collection Time    09/24/13 10:44 PM      Result  Value Ref Range   Troponin I <0.30  <0.30 ng/mL   Comment:            Due to the release kinetics of cTnI,     a negative result within the first hours     of the onset of symptoms does not rule out     myocardial infarction with certainty.     If myocardial infarction is still suspected,     repeat the test at appropriate intervals.  APTT     Status: None   Collection Time    09/25/13 12:07 AM      Result Value Ref Range   aPTT 31  24 - 37 seconds  SURGICAL PCR SCREEN     Status: None   Collection Time    09/25/13  3:05 AM      Result Value Ref Range   MRSA, PCR NEGATIVE  NEGATIVE   Staphylococcus aureus NEGATIVE  NEGATIVE   Comment:            The Xpert SA Assay (FDA     approved for NASAL specimens     in patients over 7 years of age),     is one component of     a comprehensive surveillance     program.  Test performance has     been validated by [employer] for patients greater     than or equal to 62 year old.     It is not intended     to diagnose infection nor to     guide or monitor treatment.  TROPONIN I     Status: Abnormal   Collection Time    09/25/13  4:12 AM      Result Value Ref Range   Troponin I 1.32 (*) <0.30 ng/mL   Comment:            Due to the release kinetics of cTnI,     a negative result within the first hours     of the onset of symptoms does not rule out     myocardial infarction with certainty.     If myocardial infarction is still suspected,  repeat the test at appropriate intervals.     CRITICAL RESULT CALLED TO, READ BACK BY AND VERIFIED WITH:     Olin Hauser RN 09/25/13 0715 COSTELLO B  PROTIME-INR     Status: None   Collection Time    09/25/13  4:12 AM      Result Value Ref Range   Prothrombin Time 12.6  11.6 - 15.2 seconds   INR 0.96  0.00 - 1.49  CBC WITH DIFFERENTIAL     Status: None   Collection Time    09/25/13  4:12 AM      Result Value Ref Range   WBC 10.0  4.0 - 10.5 K/uL   RBC 5.24  4.22 - 5.81 MIL/uL    Hemoglobin 16.7  13.0 - 17.0 g/dL   HCT 46.6  39.0 - 52.0 %   MCV 88.9  78.0 - 100.0 fL   MCH 31.9  26.0 - 34.0 pg   MCHC 35.8  30.0 - 36.0 g/dL   RDW 13.8  11.5 - 15.5 %   Platelets 248  150 - 400 K/uL   Neutrophils Relative % 65  43 - 77 %   Neutro Abs 6.5  1.7 - 7.7 K/uL   Lymphocytes Relative 24  12 - 46 %   Lymphs Abs 2.4  0.7 - 4.0 K/uL   Monocytes Relative 8  3 - 12 %   Monocytes Absolute 0.8  0.1 - 1.0 K/uL   Eosinophils Relative 2  0 - 5 %   Eosinophils Absolute 0.2  0.0 - 0.7 K/uL   Basophils Relative 1  0 - 1 %   Basophils Absolute 0.1  0.0 - 0.1 K/uL  LIPID PANEL     Status: Abnormal   Collection Time    09/25/13  4:12 AM      Result Value Ref Range   Cholesterol 223 (*) 0 - 200 mg/dL   Triglycerides 115  <150 mg/dL   HDL 43  >39 mg/dL   Total CHOL/HDL Ratio 5.2     VLDL 23  0 - 40 mg/dL   LDL Cholesterol 157 (*) 0 - 99 mg/dL   Comment:            Total Cholesterol/HDL:CHD Risk     Coronary Heart Disease Risk Table                         Men   Women      1/2 Average Risk   3.4   3.3      Average Risk       5.0   4.4      2 X Average Risk   9.6   7.1      3 X Average Risk  23.4   11.0                Use the calculated Patient Ratio     above and the CHD Risk Table     to determine the patient's CHD Risk.                ATP III CLASSIFICATION (LDL):      <100     mg/dL   Optimal      100-129  mg/dL   Near or Above                        Optimal      130-159  mg/dL   Borderline      160-189  mg/dL   High      >190     mg/dL   Very High  BASIC METABOLIC PANEL     Status: Abnormal   Collection Time    09/25/13  4:12 AM      Result Value Ref Range   Sodium 139  137 - 147 mEq/L   Potassium 4.3  3.7 - 5.3 mEq/L   Chloride 101  96 - 112 mEq/L   CO2 26  19 - 32 mEq/L   Glucose, Bld 130 (*) 70 - 99 mg/dL   BUN 17  6 - 23 mg/dL   Creatinine, Ser 0.82  0.50 - 1.35 mg/dL   Calcium 8.9  8.4 - 10.5 mg/dL   GFR calc non Af Amer >90  >90 mL/min   GFR calc Af  Amer >90  >90 mL/min   Comment: (NOTE)     The eGFR has been calculated using the CKD EPI equation.     This calculation has not been validated in all clinical situations.     eGFR's persistently <90 mL/min signify possible Chronic Kidney     Disease.    No results found.  Review of Systems  Constitutional: Positive for diaphoresis.  Cardiovascular: Positive for chest pain.  Gastrointestinal: Positive for nausea.  All other systems reviewed and are negative.   Blood pressure 148/82, pulse 69, temperature 98.3 F (36.8 C), temperature source Oral, resp. rate 18, height _0  (1.778 m), weight 196 lb 9.6 oz (89.177 kg), SpO2 97.00%. Physical Exam  Vitals reviewed. Constitutional: He is oriented to person, place, and time. He appears well-developed and well-nourished. No distress.  HENT:  Head: Normocephalic and atraumatic.  Eyes: EOM are normal. Pupils are equal, round, and reactive to light.  Neck: Neck supple. No thyromegaly present.  No carotid bruits  Cardiovascular: Normal rate, regular rhythm, normal heart sounds and intact distal pulses.  Exam reveals no gallop and no friction rub.   No murmur heard. Respiratory: Effort normal and breath sounds normal. He has no wheezes. He has no rales.  GI: Soft. There is no tenderness.  Musculoskeletal: He exhibits no edema.  Lymphadenopathy:    He has no cervical adenopathy.  Neurological: He is alert and oriented to person, place, and time. No cranial nerve deficit.  Skin: Skin is warm and dry.    Assessment/Plan: 63 yo with no prior cardiac history who presents with an unstable coronary syndrome and ruled in for a NSTEMI. He has severe ostial LAD disease not amenable to PCI. CABG is indicated for survival benefit and relief of symptoms. We will plan to graft the LAD and the large 1st diagonal which is a ramus/ circumflex equivalent. We also will graft the circumflex itself if possible.  I have discussed with the patient and his  family the general nature of the procedure, the need for general anesthesia, and the incisions to be used. We discussed the expected hospital stay, overall recovery and short and long term outcomes. They understand the risks include, but are not limited to death, stroke, MI, DVT/PE, bleeding, possible need for transfusion, infections, irregular heart rhythms, and other organ system dysfunction including respiratory, renal, or GI complications.   He wishes to proceed.  We will plan to proceed to OR this afternoon. He only received one dose of 75 mg of plavix, which should not increase his bleeding risk substantially.  HENDRICKSON,STEVEN C 09/25/2013, 11:47 AM

## 2013-09-25 NOTE — OR Nursing (Signed)
SICU first call @ 1805

## 2013-09-25 NOTE — Interval H&P Note (Signed)
History and Physical Interval Note:  09/25/2013 7:21 AM  Eric Warren  has presented today for surgery, with the diagnosis of Chest pain  The various methods of treatment have been discussed with the patient and family. After consideration of risks, benefits and other options for treatment, the patient has consented to  Procedure(s): LEFT HEART CATHETERIZATION WITH CORONARY ANGIOGRAM (N/A) as a surgical intervention .  The patient's history has been reviewed, patient examined, no change in status, stable for surgery.  I have reviewed the patient's chart and labs.  Questions were answered to the patient's satisfaction.     Cia Garretson S

## 2013-09-25 NOTE — H&P (Signed)
Eric Warren is an 63 y.o. male.   Chief Complaint: Chest pain HPI: 63 year old male who experienced the sudden onset of chest discomfort at about 10 PM last night. The discomfort was located in the vicinity of his Adam's apple and is described as feeling like he had a lump of food in his throat. He was not eating at the time. The discomfort worsened and and was 7/10 at its worst. He has some radiation to the back of his upper arms bilaterally. He became very anxious, had some nausea, dyspnea and clamminess. His symptoms were resolving by the time he arrived in the ED and he is pain-free right now. He has had some similar, but milder, symptoms over the past several days.   History reviewed. No pertinent past medical history.    History reviewed. No pertinent past surgical history.  No family history on file. Social History:  reports that he has never smoked. He does not have any smokeless tobacco history on file. He reports that he does not drink alcohol or use illicit drugs.  Allergies: No Known Allergies  No prescriptions prior to admission    Results for orders placed during the hospital encounter of 09/24/13 (from the past 48 hour(s))  CBC     Status: Abnormal   Collection Time    09/24/13 10:44 PM      Result Value Ref Range   WBC 9.4  4.0 - 10.5 K/uL   RBC 5.47  4.22 - 5.81 MIL/uL   Hemoglobin 17.4 (*) 13.0 - 17.0 g/dL   HCT 48.8  39.0 - 52.0 %   MCV 89.2  78.0 - 100.0 fL   MCH 31.8  26.0 - 34.0 pg   MCHC 35.7  30.0 - 36.0 g/dL   RDW 14.0  11.5 - 15.5 %   Platelets 273  150 - 400 K/uL  BASIC METABOLIC PANEL     Status: Abnormal   Collection Time    09/24/13 10:44 PM      Result Value Ref Range   Sodium 141  137 - 147 mEq/L   Potassium 4.0  3.7 - 5.3 mEq/L   Chloride 102  96 - 112 mEq/L   CO2 25  19 - 32 mEq/L   Glucose, Bld 134 (*) 70 - 99 mg/dL   BUN 19  6 - 23 mg/dL   Creatinine, Ser 1.00  0.50 - 1.35 mg/dL   Calcium 9.4  8.4 - 10.5 mg/dL   GFR calc non Af Amer  78 (*) >90 mL/min   GFR calc Af Amer >90  >90 mL/min   Comment: (NOTE)     The eGFR has been calculated using the CKD EPI equation.     This calculation has not been validated in all clinical situations.     eGFR's persistently <90 mL/min signify possible Chronic Kidney     Disease.  TROPONIN I     Status: None   Collection Time    09/24/13 10:44 PM      Result Value Ref Range   Troponin I <0.30  <0.30 ng/mL   Comment:            Due to the release kinetics of cTnI,     a negative result within the first hours     of the onset of symptoms does not rule out     myocardial infarction with certainty.     If myocardial infarction is still suspected,     repeat the test at  appropriate intervals.   No results found.  ROS Review of Systems  Constitutional: Negative for fever, chills, diaphoresis and fatigue.  HENT: Negative for congestion, rhinorrhea and sneezing.  Eyes: Negative.  Respiratory: Negative for chest tightness and shortness of breath.  Cardiovascular: Positive for chest pain. Negative for leg swelling.  Gastrointestinal: Negative for nausea, vomiting, abdominal pain, diarrhea and blood in stool.  Genitourinary: Negative for frequency, hematuria, flank pain and difficulty urinating.  Musculoskeletal: Negative for arthralgias and back pain.  Skin: Negative for rash.  Neurological: Negative for dizziness, speech difficulty, weakness, numbness and headaches.  Blood pressure 138/60, pulse 88, temperature 98.7 F (37.1 C), temperature source Oral, resp. rate 16, height _0  (1.778 m), weight 89.177 kg (196 lb 9.6 oz), SpO2 96.00%.  Physical Exam  General: Well-developed, well-nourished male in no acute distress; appearance consistent with age of record  HENT: normocephalic; atraumatic  Eyes: pupils equal, round and reactive to light; extraocular muscles intact  Neck: supple  Heart: regular rate and rhythm; no murmurs, rubs or gallops  Lungs: clear to auscultation  bilaterally  Abdomen: soft; nondistended; nontender; bowel sounds present  Extremities: No deformity; full range of motion; pulses normal  Neurologic: Awake, alert and oriented; motor function intact in all extremities and symmetric; no facial droop  Skin: Warm and dry  Psychiatric: Normal mood and affect  Assessment/Plan Chest pain R/O CAD  Cardiac cath in AM. Patient understood risks and alternatives.  Landan Fedie S 09/25/2013, 2:22 AM

## 2013-09-25 NOTE — H&P (View-Only) (Signed)
Reason for Consult:CAD post NTEMI Referring Physician: Dr. AJAY KADAKIA  Eric Warren is an 63 y.o. male.  HPI: 63 yo male presents with a cc/o chest pain  Eric Warren is a 63 yo man with no prior cardiac history. He does not see a doctor regularly but has been told he had borderline cholesterol in the past. He has no history of diabetes or hypertension. He is a life long non-smoker. His father did have a heart attack in his mid 60's.   Eric Warren presented to the ED last night with a cc/o CP. Around 10 PM he had abrupt onset of a sensation of something being stuck in his throat.He became diaphoretic and nauseated. The pain radiated to the back of his shoulders and arms. He became very anxious. The pain resolved when he got to the ED. He was admitted and ruled in for MI with a troponin of 1.32  Today he underwent cardiac cath which showed a very short left main, almost a common ostium between the LAD and circumflex. The LAD had an ostial 90-95% stenosis. The circumflex was small but had a 50% ostial lesion. There was moderate, non- hemodynamically significant disease in the RCA. He had anterior hypokinesis. He is currently pain free. He was given one dose of plavix prior to cath.  History reviewed. No pertinent past medical history.- see HPI  History reviewed. No pertinent past surgical history.  Family History  Problem Relation Age of Onset  . Coronary artery disease Father 65    had a stent    Social History:  reports that he has never smoked. He does not have any smokeless tobacco history on file. He reports that he does not drink alcohol or use illicit drugs.  Allergies: No Known Allergies  Medications:  Prior to Admission:  No prescriptions prior to admission   Scheduled: . aminocaproic acid (AMICAR) for OHS   Intravenous To OR  . [MAR HOLD] aspirin EC  81 mg Oral Daily  . cefUROXime (ZINACEF)  IV  1.5 g Intravenous To OR  . cefUROXime (ZINACEF)  IV  750 mg Intravenous To OR   . dexmedetomidine  0.1-0.7 mcg/kg/hr Intravenous To OR  . DOPamine  2-20 mcg/kg/min Intravenous To OR  . epinephrine  0.5-20 mcg/min Intravenous To OR  . heparin-papaverine-plasmalyte irrigation   Irrigation To OR  . heparin 30,000 units/NS 1000 mL solution for CELLSAVER   Other To OR  . insulin (NOVOLIN-R) infusion   Intravenous To OR  . magnesium sulfate  40 mEq Other To OR  . [MAR HOLD] metoprolol tartrate  25 mg Oral BID  . nitroGLYCERIN  2-200 mcg/min Intravenous To OR  . phenylephrine (NEO-SYNEPHRINE) Adult infusion  30-200 mcg/min Intravenous To OR  . potassium chloride  80 mEq Other To OR  . sodium chloride  3 mL Intravenous Q12H  . vancomycin  1,250 mg Intravenous To OR    Results for orders placed during the hospital encounter of 09/24/13 (from the past 48 hour(s))  CBC     Status: Abnormal   Collection Time    09/24/13 10:44 PM      Result Value Ref Range   WBC 9.4  4.0 - 10.5 K/uL   RBC 5.47  4.22 - 5.81 MIL/uL   Hemoglobin 17.4 (*) 13.0 - 17.0 g/dL   HCT 48.8  39.0 - 52.0 %   MCV 89.2  78.0 - 100.0 fL   MCH 31.8  26.0 - 34.0 pg   MCHC 35.7    30.0 - 36.0 g/dL   RDW 14.0  11.5 - 15.5 %   Platelets 273  150 - 400 K/uL  BASIC METABOLIC PANEL     Status: Abnormal   Collection Time    09/24/13 10:44 PM      Result Value Ref Range   Sodium 141  137 - 147 mEq/L   Potassium 4.0  3.7 - 5.3 mEq/L   Chloride 102  96 - 112 mEq/L   CO2 25  19 - 32 mEq/L   Glucose, Bld 134 (*) 70 - 99 mg/dL   BUN 19  6 - 23 mg/dL   Creatinine, Ser 1.00  0.50 - 1.35 mg/dL   Calcium 9.4  8.4 - 10.5 mg/dL   GFR calc non Af Amer 78 (*) >90 mL/min   GFR calc Af Amer >90  >90 mL/min   Comment: (NOTE)     The eGFR has been calculated using the CKD EPI equation.     This calculation has not been validated in all clinical situations.     eGFR's persistently <90 mL/min signify possible Chronic Kidney     Disease.  TROPONIN I     Status: None   Collection Time    09/24/13 10:44 PM      Result  Value Ref Range   Troponin I <0.30  <0.30 ng/mL   Comment:            Due to the release kinetics of cTnI,     a negative result within the first hours     of the onset of symptoms does not rule out     myocardial infarction with certainty.     If myocardial infarction is still suspected,     repeat the test at appropriate intervals.  APTT     Status: None   Collection Time    09/25/13 12:07 AM      Result Value Ref Range   aPTT 31  24 - 37 seconds  SURGICAL PCR SCREEN     Status: None   Collection Time    09/25/13  3:05 AM      Result Value Ref Range   MRSA, PCR NEGATIVE  NEGATIVE   Staphylococcus aureus NEGATIVE  NEGATIVE   Comment:            The Xpert SA Assay (FDA     approved for NASAL specimens     in patients over 7 years of age),     is one component of     a comprehensive surveillance     program.  Test performance has     been validated by [employer] for patients greater     than or equal to 62 year old.     It is not intended     to diagnose infection nor to     guide or monitor treatment.  TROPONIN I     Status: Abnormal   Collection Time    09/25/13  4:12 AM      Result Value Ref Range   Troponin I 1.32 (*) <0.30 ng/mL   Comment:            Due to the release kinetics of cTnI,     a negative result within the first hours     of the onset of symptoms does not rule out     myocardial infarction with certainty.     If myocardial infarction is still suspected,  repeat the test at appropriate intervals.     CRITICAL RESULT CALLED TO, READ BACK BY AND VERIFIED WITH:     Olin Hauser RN 09/25/13 0715 COSTELLO B  PROTIME-INR     Status: None   Collection Time    09/25/13  4:12 AM      Result Value Ref Range   Prothrombin Time 12.6  11.6 - 15.2 seconds   INR 0.96  0.00 - 1.49  CBC WITH DIFFERENTIAL     Status: None   Collection Time    09/25/13  4:12 AM      Result Value Ref Range   WBC 10.0  4.0 - 10.5 K/uL   RBC 5.24  4.22 - 5.81 MIL/uL    Hemoglobin 16.7  13.0 - 17.0 g/dL   HCT 46.6  39.0 - 52.0 %   MCV 88.9  78.0 - 100.0 fL   MCH 31.9  26.0 - 34.0 pg   MCHC 35.8  30.0 - 36.0 g/dL   RDW 13.8  11.5 - 15.5 %   Platelets 248  150 - 400 K/uL   Neutrophils Relative % 65  43 - 77 %   Neutro Abs 6.5  1.7 - 7.7 K/uL   Lymphocytes Relative 24  12 - 46 %   Lymphs Abs 2.4  0.7 - 4.0 K/uL   Monocytes Relative 8  3 - 12 %   Monocytes Absolute 0.8  0.1 - 1.0 K/uL   Eosinophils Relative 2  0 - 5 %   Eosinophils Absolute 0.2  0.0 - 0.7 K/uL   Basophils Relative 1  0 - 1 %   Basophils Absolute 0.1  0.0 - 0.1 K/uL  LIPID PANEL     Status: Abnormal   Collection Time    09/25/13  4:12 AM      Result Value Ref Range   Cholesterol 223 (*) 0 - 200 mg/dL   Triglycerides 115  <150 mg/dL   HDL 43  >39 mg/dL   Total CHOL/HDL Ratio 5.2     VLDL 23  0 - 40 mg/dL   LDL Cholesterol 157 (*) 0 - 99 mg/dL   Comment:            Total Cholesterol/HDL:CHD Risk     Coronary Heart Disease Risk Table                         Men   Women      1/2 Average Risk   3.4   3.3      Average Risk       5.0   4.4      2 X Average Risk   9.6   7.1      3 X Average Risk  23.4   11.0                Use the calculated Patient Ratio     above and the CHD Risk Table     to determine the patient's CHD Risk.                ATP III CLASSIFICATION (LDL):      <100     mg/dL   Optimal      100-129  mg/dL   Near or Above                        Optimal      130-159  mg/dL   Borderline      160-189  mg/dL   High      >190     mg/dL   Very High  BASIC METABOLIC PANEL     Status: Abnormal   Collection Time    09/25/13  4:12 AM      Result Value Ref Range   Sodium 139  137 - 147 mEq/L   Potassium 4.3  3.7 - 5.3 mEq/L   Chloride 101  96 - 112 mEq/L   CO2 26  19 - 32 mEq/L   Glucose, Bld 130 (*) 70 - 99 mg/dL   BUN 17  6 - 23 mg/dL   Creatinine, Ser 0.82  0.50 - 1.35 mg/dL   Calcium 8.9  8.4 - 10.5 mg/dL   GFR calc non Af Amer >90  >90 mL/min   GFR calc Af  Amer >90  >90 mL/min   Comment: (NOTE)     The eGFR has been calculated using the CKD EPI equation.     This calculation has not been validated in all clinical situations.     eGFR's persistently <90 mL/min signify possible Chronic Kidney     Disease.    No results found.  Review of Systems  Constitutional: Positive for diaphoresis.  Cardiovascular: Positive for chest pain.  Gastrointestinal: Positive for nausea.  All other systems reviewed and are negative.   Blood pressure 148/82, pulse 69, temperature 98.3 F (36.8 C), temperature source Oral, resp. rate 18, height 5' 10" (1.778 m), weight 196 lb 9.6 oz (89.177 kg), SpO2 97.00%. Physical Exam  Vitals reviewed. Constitutional: He is oriented to person, place, and time. He appears well-developed and well-nourished. No distress.  HENT:  Head: Normocephalic and atraumatic.  Eyes: EOM are normal. Pupils are equal, round, and reactive to light.  Neck: Neck supple. No thyromegaly present.  No carotid bruits  Cardiovascular: Normal rate, regular rhythm, normal heart sounds and intact distal pulses.  Exam reveals no gallop and no friction rub.   No murmur heard. Respiratory: Effort normal and breath sounds normal. He has no wheezes. He has no rales.  GI: Soft. There is no tenderness.  Musculoskeletal: He exhibits no edema.  Lymphadenopathy:    He has no cervical adenopathy.  Neurological: He is alert and oriented to person, place, and time. No cranial nerve deficit.  Skin: Skin is warm and dry.    Assessment/Plan: 63 yo with no prior cardiac history who presents with an unstable coronary syndrome and ruled in for a NSTEMI. He has severe ostial LAD disease not amenable to PCI. CABG is indicated for survival benefit and relief of symptoms. We will plan to graft the LAD and the large 1st diagonal which is a ramus/ circumflex equivalent. We also will graft the circumflex itself if possible.  I have discussed with the patient and his  family the general nature of the procedure, the need for general anesthesia, and the incisions to be used. We discussed the expected hospital stay, overall recovery and short and long term outcomes. They understand the risks include, but are not limited to death, stroke, MI, DVT/PE, bleeding, possible need for transfusion, infections, irregular heart rhythms, and other organ system dysfunction including respiratory, renal, or GI complications.   He wishes to proceed.  We will plan to proceed to OR this afternoon. He only received one dose of 75 mg of plavix, which should not increase his bleeding risk substantially.  Syliva Mee C 09/25/2013, 11:47 AM      

## 2013-09-25 NOTE — Anesthesia Procedure Notes (Addendum)
Procedure Name: Intubation Date/Time: 09/25/2013 3:00 PM Performed by: Raphael Gibney T Pre-anesthesia Checklist: Patient identified, Timeout performed, Emergency Drugs available, Suction available and Patient being monitored Patient Re-evaluated:Patient Re-evaluated prior to inductionOxygen Delivery Method: Circle system utilized and Simple face mask Preoxygenation: Pre-oxygenation with 100% oxygen Intubation Type: IV induction Ventilation: Mask ventilation with difficulty, Two handed mask ventilation required and Oral airway inserted - appropriate to patient size Laryngoscope Size: Mac and 4 Grade View: Grade III Tube type: Oral Tube size: 8.0 mm Number of attempts: 2 Airway Equipment and Method: Patient positioned with wedge pillow and Stylet Placement Confirmation: ETT inserted through vocal cords under direct vision,  positive ETCO2 and breath sounds checked- equal and bilateral Secured at: 24 cm Dental Injury: Teeth and Oropharynx as per pre-operative assessment  Difficulty Due To: Difficulty was anticipated and Difficult Airway- due to anterior larynx

## 2013-09-25 NOTE — Progress Notes (Signed)
ANTICOAGULATION CONSULT NOTE - Initial Consult  Pharmacy Consult for heparin Indication: chest pain/ACS  No Known Allergies  Patient Measurements: Height: 5\' 10"  (177.8 cm) Weight: 196 lb 9.6 oz (89.177 kg) (Scale C) IBW/kg (Calculated) : 73  Vital Signs: Temp: 98.7 F (37.1 C) (03/27 0135) Temp src: Oral (03/27 0135) BP: 138/60 mmHg (03/27 0135) Pulse Rate: 88 (03/27 0135)  Labs:  Recent Labs  09/24/13 2244  HGB 17.4*  HCT 48.8  PLT 273  CREATININE 1.00  TROPONINI <0.30    Estimated Creatinine Clearance: 85 ml/min (by C-G formula based on Cr of 1).   Medical History: History reviewed. No pertinent past medical history.   Assessment: 63yo male c/o CP x65min associated w/ paleness, SOB, and diaphoresis at onset, initial troponin negative, to begin heparin.  Goal of Therapy:  Heparin level 0.3-0.7 units/ml Monitor platelets by anticoagulation protocol: Yes   Plan:  [facility] EDP gave heparin 4000 units bolus followed by gtt at 1000 units/hr; will continue for now and monitor heparin levels and CBC.  Wynona Neat, PharmD, BCPS  09/25/2013,2:21 AM

## 2013-09-25 NOTE — Progress Notes (Signed)
Eric Warren notified in the cath lab about critical results for troponin of 1.32

## 2013-09-26 ENCOUNTER — Encounter (HOSPITAL_COMMUNITY): Payer: Self-pay | Admitting: *Deleted

## 2013-09-26 ENCOUNTER — Inpatient Hospital Stay (HOSPITAL_COMMUNITY): Payer: BC Managed Care – PPO

## 2013-09-26 LAB — CBC
HCT: 39 % (ref 39.0–52.0)
HCT: 40.4 % (ref 39.0–52.0)
HEMOGLOBIN: 14.2 g/dL (ref 13.0–17.0)
Hemoglobin: 13.9 g/dL (ref 13.0–17.0)
MCH: 31.7 pg (ref 26.0–34.0)
MCH: 31.6 pg (ref 26.0–34.0)
MCHC: 35.6 g/dL (ref 30.0–36.0)
MCHC: 35.1 g/dL (ref 30.0–36.0)
MCV: 88.8 fL (ref 78.0–100.0)
MCV: 90 fL (ref 78.0–100.0)
PLATELETS: 185 10*3/uL (ref 150–400)
Platelets: 200 10*3/uL (ref 150–400)
RBC: 4.39 MIL/uL (ref 4.22–5.81)
RBC: 4.49 MIL/uL (ref 4.22–5.81)
RDW: 13.8 % (ref 11.5–15.5)
RDW: 14.3 % (ref 11.5–15.5)
WBC: 18.9 10*3/uL — AB (ref 4.0–10.5)
WBC: 19 10*3/uL — ABNORMAL HIGH (ref 4.0–10.5)

## 2013-09-26 LAB — POCT I-STAT 3, ART BLOOD GAS (G3+)
ACID-BASE DEFICIT: 2 mmol/L (ref 0.0–2.0)
Acid-base deficit: 2 mmol/L (ref 0.0–2.0)
Acid-base deficit: 3 mmol/L — ABNORMAL HIGH (ref 0.0–2.0)
Bicarbonate: 22.8 mEq/L (ref 20.0–24.0)
Bicarbonate: 23.1 mEq/L (ref 20.0–24.0)
Bicarbonate: 23.7 mEq/L (ref 20.0–24.0)
O2 SAT: 97 %
O2 Saturation: 95 %
O2 Saturation: 96 %
PCO2 ART: 42.4 mmHg (ref 35.0–45.0)
PCO2 ART: 44.5 mmHg (ref 35.0–45.0)
PH ART: 7.335 — AB (ref 7.350–7.450)
PH ART: 7.349 — AB (ref 7.350–7.450)
Patient temperature: 37.8
TCO2: 24 mmol/L (ref 0–100)
TCO2: 24 mmol/L (ref 0–100)
TCO2: 25 mmol/L (ref 0–100)
pCO2 arterial: 43.3 mmHg (ref 35.0–45.0)
pH, Arterial: 7.339 — ABNORMAL LOW (ref 7.350–7.450)
pO2, Arterial: 103 mmHg — ABNORMAL HIGH (ref 80.0–100.0)
pO2, Arterial: 88 mmHg (ref 80.0–100.0)
pO2, Arterial: 93 mmHg (ref 80.0–100.0)

## 2013-09-26 LAB — GLUCOSE, CAPILLARY
GLUCOSE-CAPILLARY: 100 mg/dL — AB (ref 70–99)
GLUCOSE-CAPILLARY: 108 mg/dL — AB (ref 70–99)
GLUCOSE-CAPILLARY: 110 mg/dL — AB (ref 70–99)
GLUCOSE-CAPILLARY: 113 mg/dL — AB (ref 70–99)
GLUCOSE-CAPILLARY: 115 mg/dL — AB (ref 70–99)
GLUCOSE-CAPILLARY: 118 mg/dL — AB (ref 70–99)
GLUCOSE-CAPILLARY: 123 mg/dL — AB (ref 70–99)
GLUCOSE-CAPILLARY: 126 mg/dL — AB (ref 70–99)
GLUCOSE-CAPILLARY: 127 mg/dL — AB (ref 70–99)
GLUCOSE-CAPILLARY: 128 mg/dL — AB (ref 70–99)
GLUCOSE-CAPILLARY: 163 mg/dL — AB (ref 70–99)
Glucose-Capillary: 113 mg/dL — ABNORMAL HIGH (ref 70–99)
Glucose-Capillary: 143 mg/dL — ABNORMAL HIGH (ref 70–99)
Glucose-Capillary: 156 mg/dL — ABNORMAL HIGH (ref 70–99)
Glucose-Capillary: 103 mg/dL — ABNORMAL HIGH (ref 70–99)
Glucose-Capillary: 110 mg/dL — ABNORMAL HIGH (ref 70–99)
Glucose-Capillary: 86 mg/dL (ref 70–99)
Glucose-Capillary: 102 mg/dL — ABNORMAL HIGH (ref 70–99)
Glucose-Capillary: 111 mg/dL — ABNORMAL HIGH (ref 70–99)
Glucose-Capillary: 110 mg/dL — ABNORMAL HIGH (ref 70–99)

## 2013-09-26 LAB — POCT I-STAT, CHEM 8
BUN: 10 mg/dL (ref 6–23)
CALCIUM ION: 1.2 mmol/L (ref 1.13–1.30)
CREATININE: 0.8 mg/dL (ref 0.50–1.35)
Chloride: 98 mEq/L (ref 96–112)
Glucose, Bld: 100 mg/dL — ABNORMAL HIGH (ref 70–99)
HEMATOCRIT: 42 % (ref 39.0–52.0)
HEMOGLOBIN: 14.3 g/dL (ref 13.0–17.0)
Potassium: 3.9 mEq/L (ref 3.7–5.3)
Sodium: 138 mEq/L (ref 137–147)
TCO2: 24 mmol/L (ref 0–100)

## 2013-09-26 LAB — MAGNESIUM
MAGNESIUM: 2.5 mg/dL (ref 1.5–2.5)
Magnesium: 2.4 mg/dL (ref 1.5–2.5)

## 2013-09-26 LAB — CREATININE, SERUM
CREATININE: 0.81 mg/dL (ref 0.50–1.35)
GFR calc non Af Amer: >90 mL/min (ref >90)

## 2013-09-26 LAB — BASIC METABOLIC PANEL
BUN: 8 mg/dL (ref 6–23)
CALCIUM: 7.7 mg/dL — AB (ref 8.4–10.5)
CO2: 23 mEq/L (ref 19–32)
CREATININE: 0.67 mg/dL (ref 0.50–1.35)
Chloride: 104 mEq/L (ref 96–112)
Glucose, Bld: 121 mg/dL — ABNORMAL HIGH (ref 70–99)
Potassium: 4.2 mEq/L (ref 3.7–5.3)
Sodium: 139 mEq/L (ref 137–147)

## 2013-09-26 LAB — HEMOGLOBIN A1C
Hgb A1c MFr Bld: 5.3 % (ref <5.7)
MEAN PLASMA GLUCOSE: 105 mg/dL (ref <117)

## 2013-09-26 MED ORDER — INSULIN DETEMIR 100 UNIT/ML ~~LOC~~ SOLN
30.0000 [IU] | Freq: Once | SUBCUTANEOUS | Status: AC
  Administered 2013-09-26: 30 [IU] via SUBCUTANEOUS
  Filled 2013-09-26 (×3): qty 0.3

## 2013-09-26 MED ORDER — METOPROLOL TARTRATE 25 MG/10 ML ORAL SUSPENSION
25.0000 mg | Freq: Two times a day (BID) | ORAL | Status: DC
  Filled 2013-09-26 (×6): qty 10

## 2013-09-26 MED ORDER — FUROSEMIDE 10 MG/ML IJ SOLN
20.0000 mg | Freq: Two times a day (BID) | INTRAMUSCULAR | Status: AC
  Administered 2013-09-26 (×2): 20 mg via INTRAVENOUS
  Filled 2013-09-26 (×2): qty 2

## 2013-09-26 MED ORDER — INSULIN DETEMIR 100 UNIT/ML ~~LOC~~ SOLN
20.0000 [IU] | Freq: Every day | SUBCUTANEOUS | Status: DC
  Filled 2013-09-26: qty 0.2

## 2013-09-26 MED ORDER — METOPROLOL TARTRATE 25 MG PO TABS
25.0000 mg | ORAL_TABLET | Freq: Two times a day (BID) | ORAL | Status: DC
  Administered 2013-09-26 – 2013-09-28 (×5): 25 mg via ORAL
  Filled 2013-09-26 (×6): qty 1

## 2013-09-26 MED ORDER — INSULIN ASPART 100 UNIT/ML ~~LOC~~ SOLN
0.0000 [IU] | SUBCUTANEOUS | Status: DC
  Administered 2013-09-26: 2 [IU] via SUBCUTANEOUS

## 2013-09-26 MED ORDER — ENOXAPARIN SODIUM 40 MG/0.4ML ~~LOC~~ SOLN
40.0000 mg | Freq: Every day | SUBCUTANEOUS | Status: DC
  Administered 2013-09-26 – 2013-09-28 (×3): 40 mg via SUBCUTANEOUS
  Filled 2013-09-26 (×4): qty 0.4

## 2013-09-26 NOTE — Progress Notes (Signed)
Pt failed wean. VC of 0.45 L/min. Pt placed back on full vent support.

## 2013-09-26 NOTE — Progress Notes (Signed)
1 Day Post-Op Procedure(s) (LRB): CORONARY ARTERY BYPASS GRAFTING (CABG) (N/A) INTRAOPERATIVE TRANSESOPHAGEAL ECHOCARDIOGRAM (N/A) Subjective: Sore from laying in one position Denies nausea  Objective: Vital signs in last 24 hours: Temp:  [95 F (35 C)-100.8 F (38.2 C)] 99.9 F (37.7 C) (03/28 0700) Pulse Rate:  [72-95] 88 (03/28 0700) Cardiac Rhythm:  [-] Atrial paced (03/28 0300) Resp:  [12-30] 24 (03/28 0700) BP: (95-120)/(60-78) 113/63 mmHg (03/28 0700) SpO2:  [92 %-100 %] 98 % (03/28 0700) Arterial Line BP: (89-135)/(48-84) 112/56 mmHg (03/28 0700) FiO2 (%):  [40 %-50 %] 40 % (03/28 0205) Weight:  [196 lb 3.4 oz (89 kg)-207 lb 3.7 oz (94 kg)] 207 lb 3.7 oz (94 kg) (03/28 0515)  Hemodynamic parameters for last 24 hours: PAP: (21-42)/(12-27) 26/13 mmHg CO:  [3.3 L/min-5 L/min] 4.6 L/min CI:  [1.6 L/min/m2-2.4 L/min/m2] 2.2 L/min/m2  Intake/Output from previous day: 03/27 0701 - 03/28 0700 In: 5252.2 [I.V.:3257.2; Blood:575; NG/GT:120; IV [id number]] Out: 7342 [Urine:2705; Emesis/NG output:150; Blood:1560; Chest Tube:340] Intake/Output this shift:    General appearance: alert and no distress Neurologic: intact Heart: regular rate and rhythm Lungs: clear to auscultation bilaterally Abdomen: normal findings: soft, non-tender  Lab Results:  Recent Labs  09/25/13 1900 09/25/13 1919 09/26/13 0404  WBC 21.2*  --  18.9*  HGB 14.3 13.6 13.9  HCT 39.8 40.0 39.0  PLT 163  --  185   BMET:  Recent Labs  09/25/13 0412  09/25/13 1919 09/26/13 0430  NA 139  < > 139 139  K 4.3  < > 3.9 4.2  CL 101  --   --  104  CO2 26  --   --  23  GLUCOSE 130*  < > 133* 121*  BUN 17  --   --  8  CREATININE 0.82  --   --  0.67  CALCIUM 8.9  --   --  7.7*  < > = values in this interval not displayed.  PT/INR:  Recent Labs  09/25/13 1900  LABPROT 15.6*  INR 1.27   ABG    Component Value Date/Time   PHART 7.339* 09/26/2013 0357   HCO3 23.7 09/26/2013 0357   TCO2 25  09/26/2013 0357   ACIDBASEDEF 2.0 09/26/2013 0357   O2SAT 97.0 09/26/2013 0357   CBG (last 3)   Recent Labs  09/25/13 2154 09/25/13 2246 09/25/13 2349  GLUCAP 115* 126* 128*    Assessment/Plan: S/P Procedure(s) (LRB): CORONARY ARTERY BYPASS GRAFTING (CABG) (N/A) INTRAOPERATIVE TRANSESOPHAGEAL ECHOCARDIOGRAM (N/A) POD # 1 CABG Doing well  CV- stable, good index- dc swan  ASA, beta blocker, statin  RESP- IS  RENAL- diurese  ENDO- transition to levemir  DC CT  OOB, ambulate   LOS: 2 days    Petar Mucci C 09/26/2013

## 2013-09-26 NOTE — Procedures (Signed)
Extubation Procedure Note  Patient Details:   Name: Juniper Snyders DOB: 1951-06-19 MRN: [id number]   Airway Documentation:     Evaluation  O2 sats: stable throughout Complications: No apparent complications Patient did tolerate procedure well. Bilateral Breath Sounds: Clear;Diminished   Yes  NIF -46 cmH2O and VC of 0.50 L/min. Pt extubated to 4L [place] with sat of 93%.   Dulcy Fanny 09/26/2013, 2:50 AM

## 2013-09-26 NOTE — Op Note (Signed)
Eric, Warren NO.:  [id number]  MEDICAL RECORD NO.:  [id number]  LOCATION:  2S01C                        FACILITY:  [facility]  PHYSICIAN:  Revonda Standard. Eric Warren, M.D.DATE OF BIRTH:  1951-04-08  DATE OF PROCEDURE:  09/25/2013 DATE OF DISCHARGE:                              OPERATIVE REPORT   PREOPERATIVE DIAGNOSIS:  Severe two-vessel coronary artery disease, status post non-Q-wave myocardial infarction.  POSTOPERATIVE DIAGNOSIS:  Severe two-vessel coronary artery disease, status post non-Q-wave myocardial infarction.  PROCEDURE:  Median sternotomy, extracorporeal circulation, coronary artery bypass grafting x3 (left internal mammary artery to left anterior descending, sequential saphenous vein graft to first diagonal and obtuse marginal 1), endoscopic vein harvest right thigh.  SURGEON:  Revonda Standard. Eric Hockey, MD  ASSISTANT:  Lars Pinks, PA  ANESTHESIA:  General.  FINDINGS:  Good quality conduits, good quality targets.  Transesophageal echocardiography revealed preserved left ventricular wall motion with no valvular pathology.  CLINICAL NOTE:  Eric Warren is a 63 year old gentleman with no previous significant medical history, who presented after an episode of unstable chest pain.  He ruled in for a non-Q-wave MI.  He underwent cardiac catheterization on the morning of September 25, 2013, and was found to have a critical ostial LAD stenosis, as well as moderate disease in the left circumflex.  He was advised to undergo urgent coronary artery bypass grafting.  The indications, risks, benefits, and alternatives were discussed in detail with the patient and are outlined in the consultation note.  He understood and accepted the risks and agreed to proceed.  OPERATIVE NOTE:  [name] was brought to the preoperative holding area on September 25, 2013.  There Anesthesia placed a Swan-Ganz catheter and an arterial blood pressure monitoring line.   Intravenous antibiotics were administered.  He was taken to the operating room, anesthetized, and intubated.  A Foley catheter was placed.  Transesophageal echocardiography was performed.  It revealed preserved left ventricular wall motion and no significant valvular pathology.  There was borderline left ventricular hypertrophy.  The chest, abdomen, and legs were prepped and draped in usual sterile fashion.  A median sternotomy was performed and the left internal mammary artery was harvested using standard technique.  Simultaneously, an incision was made in the medial aspect of the right leg at the level of the knee.  The greater saphenous vein was identified and was harvested endoscopically from the lower right thigh.  Both the saphenous vein and mammary artery were good quality conduits.  Heparin 2000 units was administered during the vessel harvest.  The remainder of the full heparin dose was given prior to opening the pericardium.  After confirming adequate anticoagulation with ACT measurement, the pericardium was opened.  The ascending aorta was of normal caliber with no palpable atherosclerotic disease.  The aorta was cannulated via concentric 2-0 Ethibond pledgeted pursestring sutures.  A dual-stage venous cannula was placed via pursestring suture in the right atrial appendage.  Cardiopulmonary bypass was instituted.  The patient was cooled to 32 degrees Celsius.  The coronary arteries were inspected. The anastomotic sites were chosen and it was felt that OM 1 was graftable although was a small vessel.  The LAD and first diagonal  were both good quality and large caliber targets. A foam pad was placed in the pericardium to insulate the heart and protect the left phrenic nerve.  A temperature probe was placed in the myocardial septum and a cardioplegia cannula was placed in the ascending aorta.  The aorta was crossclamped.  The left ventricle was emptied via the aortic root  vent.  Cardiac arrest then was achieved with a combination of cold antegrade blood cardioplegia and topical iced saline.  750 mL of cardioplegia was administered.  There was rapid diastolic arrest and myocardial septal temperature cooled to 10 degrees Celsius.  The following distal anastomoses were performed.  First, a reversed saphenous vein graft was placed sequentially to the first diagonal and first obtuse marginal.  These were both good quality targets.  The first diagonal was a 2-mm good quality target.  The first OM was small. It did accept a 1.5-mm probe proximally but only a 1-mm probe distally.  It was a very thin-walled vessel.  A side-to-side anastomosis was performed to the diagonal and an end-to-side to the OM. Both were done with running 7-0 Prolene sutures.  At the completion of each anastomosis, a probe was passed proximally and distally to ensure patency.  Cardioplegia was administered to assess flow and hemostasis. There was good flow and good hemostasis at both anastomoses.  Additional cardioplegia was administered down the vein graft as well as the aortic root.  Next, the left internal mammary artery was brought through a window in the pericardium.  The distal end was beveled. It was anastomosed end-to- side to the mid LAD.  The LAD was a 2 mm good quality target.  The mammary was a 2 mm good quality conduit.  The end-to-side anastomosis was performed with a running 8-0 Prolene suture.  At the completion of the anastomosis, the bulldog clamp was briefly removed to inspect for hemostasis.  Rapid septal rewarming was noted.  The bulldog clamp was replaced.  The mammary pedicle was tacked to the epicardial surface of the heart with 6-0 Prolene sutures.  The vein graft was cut to length.  The cardioplegia cannula was removed from the ascending aorta.  The proximal vein graft anastomosis was performed to a 4.5 mm punch aortotomy with running 6-0 Prolene suture. At the  completion of final proximal anastomosis, the patient was placed in Trendelenburg position.  Lidocaine was administered.  The aortic root was de-aired, and the aortic crossclamp was removed.  Total crossclamp time was 47 minutes. The patient spontaneously resumed sinus rhythm and did not require defibrillation.  While rewarming was completed, all proximal and distal anastomoses were inspected for hemostasis.  Epicardial pacing wires were placed on the right ventricle and right atrium.  When the patient had rewarmed to a core temperature of 37 degrees Celsius, he was weaned from cardiopulmonary bypass on the first attempt without difficulty.  He was in sinus rhythm and on no inotropic support.  Postbypass transesophageal echocardiography showed no change in the preserved left ventricular wall motion.  The initial cardiac index was greater than 2 L/min/m2.  The patient remained hemodynamically stable throughout the postbypass period.  A test dose of protamine was administered and was well tolerated.  The atrial and aortic cannulae were removed.  The remainder of the protamine was administered without incident.  Chest was irrigated with warm saline.  Hemostasis was achieved.  The pericardium was reapproximated with interrupted 3-0 silk sutures.  It came together easily without tension or kinking of the underlying  grafts.  The left pleural and mediastinal chest tubes were placed through separate subcostal incisions.  The sternum was closed with combination of single and double heavy gauge stainless steel wires.  The pectoralis fascia, subcutaneous tissue, and skin were closed in standard fashion.  All sponge, needle, and instrument counts were correct at the end of the procedure.  The patient was taken from the operating room to the surgical intensive care unit in good condition.     Revonda Standard Eric Warren, M.D.     SCH/MEDQ  D:  09/25/2013  T:  09/26/2013  Job:  [id number]

## 2013-09-26 NOTE — Progress Notes (Signed)
Subjective:  Day 1 post op 3V bypass-LIMA to LAD, SVG to 1st diagonal and OM. T max 100.8  Objective:  Vital Signs in the last 24 hours: Temp:  [95 F (35 C)-100.8 F (38.2 C)] 100 F (37.8 C) (03/28 0900) Pulse Rate:  [72-102] 85 (03/28 1300) Cardiac Rhythm:  [-] Normal sinus rhythm (03/28 0800) Resp:  [12-30] 22 (03/28 1300) BP: (81-153)/(60-121) 115/76 mmHg (03/28 1300) SpO2:  [92 %-100 %] 97 % (03/28 1300) Arterial Line BP: (89-154)/(48-93) 154/75 mmHg (03/28 1100) FiO2 (%):  [40 %-50 %] 40 % (03/28 0205) Weight:  [89 kg (196 lb 3.4 oz)-94 kg (207 lb 3.7 oz)] 94 kg (207 lb 3.7 oz) (03/28 0515)  Physical Exam: BP Readings from Last 1 Encounters:  09/26/13 115/76     Wt Readings from Last 1 Encounters:  09/26/13 94 kg (207 lb 3.7 oz)    Weight change: 2.817 kg (6 lb 3.4 oz)  HEENT: [place]/AT, Eyes-Hazel, PERL, EOMI, Conjunctiva-Pink, Sclera-Non-icteric Neck: No JVD, No bruit, Trachea midline. Lungs:  Clear, Bilateral. Dressing on scar of surgery. Cardiac:  Regular rhythm, normal S1 and S2, no S3.  Abdomen:  Soft, non-tender. Extremities:  No edema present. No cyanosis. No clubbing. CNS: AxOx3, Cranial nerves grossly intact, moves all 4 extremities. Right handed. Skin: Warm and dry.   Intake/Output from previous day: 03/27 0701 - 03/28 0700 In: 5258.2 [I.V.:3263.2; Blood:575; NG/GT:120; IV Piggyback:1300] Out: 0539 [Urine:2705; Emesis/NG output:150; Blood:1560; Chest Tube:360]    Lab Results: BMET    Component Value Date/Time   NA 139 09/26/2013 0430   K 4.2 09/26/2013 0430   CL 104 09/26/2013 0430   CO2 23 09/26/2013 0430   GLUCOSE 121* 09/26/2013 0430   BUN 8 09/26/2013 0430   CREATININE 0.67 09/26/2013 0430   CALCIUM 7.7* 09/26/2013 0430   GFRNONAA >90 09/26/2013 0430   GFRAA >90 09/26/2013 0430   CBC    Component Value Date/Time   WBC 18.9* 09/26/2013 0404   RBC 4.39 09/26/2013 0404   HGB 13.9 09/26/2013 0404   HCT 39.0 09/26/2013 0404   PLT 185 09/26/2013 0404   MCV 88.8 09/26/2013 0404   MCH 31.7 09/26/2013 0404   MCHC 35.6 09/26/2013 0404   RDW 13.8 09/26/2013 0404   LYMPHSABS 2.4 09/25/2013 0412   MONOABS 0.8 09/25/2013 0412   EOSABS 0.2 09/25/2013 0412   BASOSABS 0.1 09/25/2013 0412   CARDIAC ENZYMES Lab Results  Component Value Date   TROPONINI 1.32* 09/25/2013    Scheduled Meds: . acetaminophen  1,000 mg Oral 4 times per day   Or  . acetaminophen (TYLENOL) oral liquid 160 mg/5 mL  1,000 mg Per Tube 4 times per day  . aspirin EC  325 mg Oral Daily   Or  . aspirin  324 mg Per Tube Daily  . bisacodyl  10 mg Oral Daily   Or  . bisacodyl  10 mg Rectal Daily  . cefUROXime (ZINACEF)  IV  1.5 g Intravenous Q12H  . docusate sodium  200 mg Oral Daily  . enoxaparin (LOVENOX) injection  40 mg Subcutaneous QHS  . famotidine (PEPCID) IV  20 mg Intravenous Q12H  . furosemide  20 mg Intravenous BID  . insulin aspart  0-24 Units Subcutaneous 6 times per day  . [START ON 09/27/2013] insulin detemir  20 Units Subcutaneous Daily  . insulin regular  0-10 Units Intravenous TID WC  . metoprolol tartrate  25 mg Oral BID   Or  . metoprolol tartrate  25 mg Per Tube BID  . [START ON 09/27/2013] pantoprazole  40 mg Oral Daily  . simvastatin  20 mg Oral q1800  . sodium chloride  3 mL Intravenous Q12H   Continuous Infusions: . sodium chloride Stopped (09/26/13 1000)  . sodium chloride 10 mL/hr (09/26/13 5456)  . sodium chloride    . dexmedetomidine 0.7 mcg/kg/hr (09/25/13 1947)  . insulin (NOVOLIN-R) infusion 2.2 Units/hr (09/25/13 1945)  . lactated ringers 20 mL/hr at 09/26/13 1300  . nitroGLYCERIN 10 mcg/min (09/25/13 1950)  . phenylephrine (NEO-SYNEPHRINE) Adult infusion Stopped (09/26/13 0800)   PRN Meds:.metoprolol, midazolam, morphine injection, ondansetron (ZOFRAN) IV, oxyCODONE, sodium chloride  Assessment/Plan: Multivessel native vessel CAD 3 V CABG  Follow with surgery.   LOS: 2 days    Dixie Dials  MD  09/26/2013, 2:02 PM     Da

## 2013-09-27 ENCOUNTER — Inpatient Hospital Stay (HOSPITAL_COMMUNITY): Payer: BC Managed Care – PPO

## 2013-09-27 LAB — BASIC METABOLIC PANEL
BUN: 14 mg/dL (ref 6–23)
CALCIUM: 8.5 mg/dL (ref 8.4–10.5)
CO2: 26 mEq/L (ref 19–32)
CREATININE: 0.8 mg/dL (ref 0.50–1.35)
Chloride: 98 mEq/L (ref 96–112)
GFR calc non Af Amer: >90 mL/min (ref >90)
Glucose, Bld: 118 mg/dL — ABNORMAL HIGH (ref 70–99)
Potassium: 4.1 mEq/L (ref 3.7–5.3)
Sodium: 134 mEq/L — ABNORMAL LOW (ref 137–147)

## 2013-09-27 LAB — GLUCOSE, CAPILLARY
GLUCOSE-CAPILLARY: 98 mg/dL (ref 70–99)
Glucose-Capillary: 101 mg/dL — ABNORMAL HIGH (ref 70–99)
Glucose-Capillary: 109 mg/dL — ABNORMAL HIGH (ref 70–99)
Glucose-Capillary: 110 mg/dL — ABNORMAL HIGH (ref 70–99)
Glucose-Capillary: 115 mg/dL — ABNORMAL HIGH (ref 70–99)
Glucose-Capillary: 125 mg/dL — ABNORMAL HIGH (ref 70–99)

## 2013-09-27 LAB — CBC
HCT: 35.2 % — ABNORMAL LOW (ref 39.0–52.0)
Hemoglobin: 12.6 g/dL — ABNORMAL LOW (ref 13.0–17.0)
MCH: 32.1 pg (ref 26.0–34.0)
MCHC: 35.8 g/dL (ref 30.0–36.0)
MCV: 89.8 fL (ref 78.0–100.0)
PLATELETS: 163 10*3/uL (ref 150–400)
RBC: 3.92 MIL/uL — ABNORMAL LOW (ref 4.22–5.81)
RDW: 14.2 % (ref 11.5–15.5)
WBC: 16.2 10*3/uL — ABNORMAL HIGH (ref 4.0–10.5)

## 2013-09-27 MED ORDER — FUROSEMIDE 40 MG PO TABS
40.0000 mg | ORAL_TABLET | Freq: Every day | ORAL | Status: DC
  Administered 2013-09-28: 40 mg via ORAL
  Filled 2013-09-27: qty 1

## 2013-09-27 MED ORDER — NEPAFENAC 0.1 % OP SUSP
1.0000 [drp] | OPHTHALMIC | Status: DC
  Administered 2013-09-27 – 2013-09-29 (×2): 1 [drp] via OPHTHALMIC
  Filled 2013-09-27: qty 3

## 2013-09-27 MED ORDER — OCUVITE PO TABS
1.0000 | ORAL_TABLET | Freq: Every day | ORAL | Status: DC
  Administered 2013-09-28 – 2013-09-29 (×2): 1 via ORAL
  Filled 2013-09-27 (×3): qty 1

## 2013-09-27 MED ORDER — SODIUM CHLORIDE 0.9 % IJ SOLN
3.0000 mL | Freq: Two times a day (BID) | INTRAMUSCULAR | Status: DC
  Administered 2013-09-27 – 2013-09-28 (×3): 3 mL via INTRAVENOUS

## 2013-09-27 MED ORDER — GUAIFENESIN-DM 100-10 MG/5ML PO SYRP: 15.0000 mL | ORAL_SOLUTION | ORAL | Status: DC | PRN

## 2013-09-27 MED ORDER — FUROSEMIDE 10 MG/ML IJ SOLN
40.0000 mg | Freq: Once | INTRAMUSCULAR | Status: AC
  Administered 2013-09-27: 40 mg via INTRAVENOUS
  Filled 2013-09-27: qty 4

## 2013-09-27 MED ORDER — ALPRAZOLAM 0.25 MG PO TABS: 0.2500 mg | ORAL_TABLET | Freq: Four times a day (QID) | ORAL | Status: DC | PRN

## 2013-09-27 MED ORDER — SODIUM CHLORIDE 0.9 % IV SOLN: 250.0000 mL | INTRAVENOUS | Status: DC | PRN

## 2013-09-27 MED ORDER — POTASSIUM CHLORIDE CRYS ER 20 MEQ PO TBCR
20.0000 meq | EXTENDED_RELEASE_TABLET | Freq: Two times a day (BID) | ORAL | Status: DC
  Administered 2013-09-27 – 2013-09-28 (×3): 20 meq via ORAL
  Filled 2013-09-27 (×5): qty 1

## 2013-09-27 MED ORDER — MOVING RIGHT ALONG BOOK
Freq: Once | Status: AC
  Administered 2013-09-27: 17:00:00
  Filled 2013-09-27: qty 1

## 2013-09-27 MED ORDER — SODIUM CHLORIDE 0.9 % IJ SOLN
3.0000 mL | INTRAMUSCULAR | Status: DC | PRN
  Administered 2013-09-28: 3 mL via INTRAVENOUS

## 2013-09-27 MED ORDER — ALUM & MAG HYDROXIDE-SIMETH 200-200-20 MG/5ML PO SUSP: 15.0000 mL | ORAL | Status: DC | PRN

## 2013-09-27 MED ORDER — MAGNESIUM HYDROXIDE 400 MG/5ML PO SUSP: 30.0000 mL | Freq: Every day | ORAL | Status: DC | PRN

## 2013-09-27 NOTE — Progress Notes (Signed)
Subjective:  Doing better with lasix use. Afebrile.  Objective:  Vital Signs in the last 24 hours: Temp:  [97.7 F (36.5 C)-98.5 F (36.9 C)] 97.9 F (36.6 C) (03/29 0710) Pulse Rate:  [64-111] 111 (03/29 0900) Cardiac Rhythm:  [-] Normal sinus rhythm (03/29 0730) Resp:  [11-37] 32 (03/29 0900) BP: (87-153)/(57-121) 143/84 mmHg (03/29 0900) SpO2:  [90 %-98 %] 95 % (03/29 0900) Arterial Line BP: (146-154)/(63-75) 154/75 mmHg (03/28 1100) Weight:  [93.6 kg (206 lb 5.6 oz)] 93.6 kg (206 lb 5.6 oz) (03/29 0500)  Physical Exam: BP Readings from Last 1 Encounters:  09/27/13 143/84     Wt Readings from Last 1 Encounters:  09/27/13 93.6 kg (206 lb 5.6 oz)    Weight change: 4.6 kg (10 lb 2.3 oz)  HEENT: [place]/AT, Eyes-Hazel, PERL, EOMI, Conjunctiva-Pink, Sclera-Non-icteric Neck: No JVD, No bruit, Trachea midline. Lungs:  Clear, Bilateral. Midline dressing. Cardiac:  Regular rhythm, normal S1 and S2, no S3.  Abdomen:  Soft, non-tender. Extremities:  No edema present. No cyanosis. No clubbing. CNS: AxOx3, Cranial nerves grossly intact, moves all 4 extremities. Right handed. Skin: Warm and dry.   Intake/Output from previous day: 03/28 0701 - 03/29 0700 In: 1986.1 [P.O.:1440; I.V.:446.1; IV Piggyback:100] Out: 1155 [Urine:1085; Chest Tube:70]    Lab Results: BMET    Component Value Date/Time   NA 134* 09/27/2013 0355   K 4.1 09/27/2013 0355   CL 98 09/27/2013 0355   CO2 26 09/27/2013 0355   GLUCOSE 118* 09/27/2013 0355   BUN 14 09/27/2013 0355   CREATININE 0.80 09/27/2013 0355   CALCIUM 8.5 09/27/2013 0355   GFRNONAA >90 09/27/2013 0355   GFRAA >90 09/27/2013 0355   CBC    Component Value Date/Time   WBC 16.2* 09/27/2013 0355   RBC 3.92* 09/27/2013 0355   HGB 12.6* 09/27/2013 0355   HCT 35.2* 09/27/2013 0355   PLT 163 09/27/2013 0355   MCV 89.8 09/27/2013 0355   MCH 32.1 09/27/2013 0355   MCHC 35.8 09/27/2013 0355   RDW 14.2 09/27/2013 0355   LYMPHSABS 2.4 09/25/2013 0412   MONOABS  0.8 09/25/2013 0412   EOSABS 0.2 09/25/2013 0412   BASOSABS 0.1 09/25/2013 0412   CARDIAC ENZYMES Lab Results  Component Value Date   TROPONINI 1.32* 09/25/2013    Scheduled Meds: . acetaminophen  1,000 mg Oral 4 times per day   Or  . acetaminophen (TYLENOL) oral liquid 160 mg/5 mL  1,000 mg Per Tube 4 times per day  . aspirin EC  325 mg Oral Daily   Or  . aspirin  324 mg Per Tube Daily  . beta carotene w/minerals  1 tablet Oral Daily  . bisacodyl  10 mg Oral Daily   Or  . bisacodyl  10 mg Rectal Daily  . docusate sodium  200 mg Oral Daily  . enoxaparin (LOVENOX) injection  40 mg Subcutaneous QHS  . [START ON 09/28/2013] furosemide  40 mg Oral Daily  . metoprolol tartrate  25 mg Oral BID   Or  . metoprolol tartrate  25 mg Per Tube BID  . nepafenac  1 drop Both Eyes Once per day on Sun Tue Thu Sat  . pantoprazole  40 mg Oral Daily  . potassium chloride  20 mEq Oral BID  . simvastatin  20 mg Oral q1800  . sodium chloride  3 mL Intravenous Q12H   Continuous Infusions: . sodium chloride Stopped (09/26/13 1000)  . sodium chloride 10 mL/hr (09/26/13 3664)  .  sodium chloride    . dexmedetomidine 0.7 mcg/kg/hr (09/25/13 1947)  . lactated ringers Stopped (09/26/13 1600)  . nitroGLYCERIN 10 mcg/min (09/25/13 1950)  . phenylephrine (NEO-SYNEPHRINE) Adult infusion Stopped (09/26/13 0800)   PRN Meds:.metoprolol, midazolam, morphine injection, ondansetron (ZOFRAN) IV, oxyCODONE, sodium chloride  Assessment/Plan: Multivessel native vessel CAD  3 V CABG Hyperglycemia  Check Hgb A1C.   LOS: 3 days    Dixie Dials  MD  09/27/2013, 9:29 AM

## 2013-09-27 NOTE — Progress Notes (Signed)
2 Days Post-Op Procedure(s) (LRB): CORONARY ARTERY BYPASS GRAFTING (CABG) (N/A) INTRAOPERATIVE TRANSESOPHAGEAL ECHOCARDIOGRAM (N/A) Subjective: Some soreness  Objective: Vital signs in last 24 hours: Temp:  [97.7 F (36.5 C)-100 F (37.8 C)] 97.9 F (36.6 C) (03/29 0710) Pulse Rate:  [64-102] 91 (03/29 0700) Cardiac Rhythm:  [-] Normal sinus rhythm (03/29 0730) Resp:  [11-37] 19 (03/29 0700) BP: (81-153)/(57-121) 126/67 mmHg (03/29 0700) SpO2:  [90 %-98 %] 98 % (03/29 0700) Arterial Line BP: (123-154)/(63-93) 154/75 mmHg (03/28 1100) Weight:  [206 lb 5.6 oz (93.6 kg)] 206 lb 5.6 oz (93.6 kg) (03/29 0500)  Hemodynamic parameters for last 24 hours: PAP: (32-45)/(18-24) 32/18 mmHg  Intake/Output from previous day: 03/28 0701 - 03/29 0700 In: 1986.1 [P.O.:1440; I.V.:446.1; IV Piggyback:100] Out: 1155 [Urine:1085; Chest Tube:70] Intake/Output this shift:    General appearance: alert and no distress Neurologic: intact Heart: regular rate and rhythm Lungs: diminished breath sounds bibasilar Abdomen: normal findings: soft, non-tender  Lab Results:  Recent Labs  09/26/13 1654 09/27/13 0355  WBC 19.0* 16.2*  HGB 14.2 12.6*  HCT 40.4 35.2*  PLT 200 163   BMET:  Recent Labs  09/26/13 0430 09/26/13 1648 09/26/13 1654 09/27/13 0355  NA 139 138  --  134*  K 4.2 3.9  --  4.1  CL 104 98  --  98  CO2 23  --   --  26  GLUCOSE 121* 100*  --  118*  BUN 8 10  --  14  CREATININE 0.67 0.80 0.81 0.80  CALCIUM 7.7*  --   --  8.5    PT/INR:  Recent Labs  09/25/13 1900  LABPROT 15.6*  INR 1.27   ABG    Component Value Date/Time   PHART 7.339* 09/26/2013 0357   HCO3 23.7 09/26/2013 0357   TCO2 24 09/26/2013 1648   ACIDBASEDEF 2.0 09/26/2013 0357   O2SAT 97.0 09/26/2013 0357   CBG (last 3)   Recent Labs  09/26/13 1931 09/27/13 0017 09/27/13 0349  GLUCAP 110* 109* 110*    Assessment/Plan: S/P Procedure(s) (LRB): CORONARY ARTERY BYPASS GRAFTING (CABG)  (N/A) INTRAOPERATIVE TRANSESOPHAGEAL ECHOCARDIOGRAM (N/A) Plan for transfer to step-down: see transfer orders  POD # 2 CABG  Doing well  I doubt his weight differential is accurate but he is volume overloaded- will give IV lasix this AM  Continue IS, ambulation  Transfer to 2000   LOS: 3 days    HENDRICKSON,STEVEN C 09/27/2013

## 2013-09-28 ENCOUNTER — Inpatient Hospital Stay (HOSPITAL_COMMUNITY): Payer: BC Managed Care – PPO

## 2013-09-28 LAB — CBC
HCT: 34.6 % — ABNORMAL LOW (ref 39.0–52.0)
Hemoglobin: 12.1 g/dL — ABNORMAL LOW (ref 13.0–17.0)
MCH: 31.7 pg (ref 26.0–34.0)
MCHC: 35 g/dL (ref 30.0–36.0)
MCV: 90.6 fL (ref 78.0–100.0)
PLATELETS: 169 10*3/uL (ref 150–400)
RBC: 3.82 MIL/uL — ABNORMAL LOW (ref 4.22–5.81)
RDW: 14 % (ref 11.5–15.5)
WBC: 13.5 10*3/uL — AB (ref 4.0–10.5)

## 2013-09-28 LAB — BASIC METABOLIC PANEL
BUN: 16 mg/dL (ref 6–23)
CO2: 27 mEq/L (ref 19–32)
CREATININE: 0.8 mg/dL (ref 0.50–1.35)
Calcium: 8.8 mg/dL (ref 8.4–10.5)
Chloride: 100 mEq/L (ref 96–112)
GFR calc non Af Amer: >90 mL/min (ref >90)
Glucose, Bld: 109 mg/dL — ABNORMAL HIGH (ref 70–99)
Potassium: 4.4 mEq/L (ref 3.7–5.3)
Sodium: 139 mEq/L (ref 137–147)

## 2013-09-28 LAB — HEMOGLOBIN A1C
Hgb A1c MFr Bld: 5.4 % (ref <5.7)
Mean Plasma Glucose: 108 mg/dL (ref <117)

## 2013-09-28 LAB — POCT ACTIVATED CLOTTING TIME: Activated Clotting Time: 82 seconds

## 2013-09-28 MED ORDER — ACETAMINOPHEN 325 MG PO TABS: 650.0000 mg | ORAL_TABLET | ORAL | Status: DC | PRN

## 2013-09-28 MED ORDER — LACTULOSE 10 GM/15ML PO SOLN
20.0000 g | Freq: Every day | ORAL | Status: DC | PRN
  Filled 2013-09-28: qty 30

## 2013-09-28 MED ORDER — METOPROLOL TARTRATE 25 MG PO TABS
37.5000 mg | ORAL_TABLET | Freq: Two times a day (BID) | ORAL | Status: DC
  Administered 2013-09-28 (×2): 37.5 mg via ORAL
  Filled 2013-09-28 (×4): qty 1

## 2013-09-28 MED ORDER — SODIUM CHLORIDE 0.9 % IV SOLN: INTRAVENOUS | Status: DC

## 2013-09-28 MED ORDER — LISINOPRIL 5 MG PO TABS
5.0000 mg | ORAL_TABLET | Freq: Every day | ORAL | Status: DC
  Administered 2013-09-28 – 2013-09-29 (×2): 5 mg via ORAL
  Filled 2013-09-28 (×2): qty 1

## 2013-09-28 MED FILL — Magnesium Sulfate Inj 50%: INTRAMUSCULAR | Qty: 10 | Status: AC

## 2013-09-28 MED FILL — Potassium Chloride Inj 2 mEq/ML: INTRAVENOUS | Qty: 40 | Status: AC

## 2013-09-28 MED FILL — Heparin Sodium (Porcine) Inj 1000 Unit/ML: INTRAMUSCULAR | Qty: 30 | Status: AC

## 2013-09-28 NOTE — Progress Notes (Addendum)
      [address]       [place] [address]             [phone]      3 Days Post-Op Procedure(s) (LRB): CORONARY ARTERY BYPASS GRAFTING (CABG) (N/A) INTRAOPERATIVE TRANSESOPHAGEAL ECHOCARDIOGRAM (N/A)  Subjective:  Eric Warren complains of some incisional soreness.  Otherwise he is doing well.  He is ambulating some.  No BM, + flatus  Objective: Vital signs in last 24 hours: Temp:  [97.8 F (36.6 C)-98.1 F (36.7 C)] 98 F (36.7 C) (03/30 0357) Pulse Rate:  [96-111] 103 (03/30 0357) Cardiac Rhythm:  [-] Sinus tachycardia (03/29 2021) Resp:  [18-32] 18 (03/30 0357) BP: (128-148)/(68-84) 148/76 mmHg (03/30 0357) SpO2:  [91 %-97 %] 95 % (03/30 0357) Weight:  [186 lb 8.2 oz (84.6 kg)] 186 lb 8.2 oz (84.6 kg) (03/30 0357)  Intake/Output from previous day: 03/29 0701 - 03/30 0700 In: 50 [IV Piggyback:50] Out: 1065 [Urine:1065]  General appearance: alert, cooperative and no distress Heart: regular rate and rhythm Lungs: clear to auscultation bilaterally Abdomen: soft, non-tender; bowel sounds normal; no masses,  no organomegaly Extremities: edema trace Wound: clean and dry  Lab Results:  Recent Labs  09/27/13 0355 09/28/13 0430  WBC 16.2* 13.5*  HGB 12.6* 12.1*  HCT 35.2* 34.6*  PLT 163 169   BMET:  Recent Labs  09/27/13 0355 09/28/13 0430  NA 134* 139  K 4.1 4.4  CL 98 100  CO2 26 27  GLUCOSE 118* 109*  BUN 14 16  CREATININE 0.80 0.80  CALCIUM 8.5 8.8    PT/INR:  Recent Labs  09/25/13 1900  LABPROT 15.6*  INR 1.27   ABG    Component Value Date/Time   PHART 7.339* 09/26/2013 0357   HCO3 23.7 09/26/2013 0357   TCO2 24 09/26/2013 1648   ACIDBASEDEF 2.0 09/26/2013 0357   O2SAT 97.0 09/26/2013 0357   CBG (last 3)   Recent Labs  09/27/13 0712 09/27/13 1207 09/27/13 1648  GLUCAP 115* 101* 98    Assessment/Plan: S/P Procedure(s) (LRB): CORONARY ARTERY BYPASS GRAFTING (CABG) (N/A) INTRAOPERATIVE TRANSESOPHAGEAL  ECHOCARDIOGRAM (N/A)  1. CV- NSR, tachy, hypertensive- on Lopressor, will add Lisinopril 2. Pulm- pain with deep inspiration, encouraged use of IS 3. Renal- creatinine WNL, weight is below admission if accurate continue Lasix 4. Dispo- patient progressing, add Lisinopril for blood pressure control, d/c EPW, hopefully home in next 24-48 hrs  LOS: 4 days    Eric Warren, Eric Warren 09/28/2013  Patient seen and examined, agree with above Probably home tomorrow if he continues to progress

## 2013-09-28 NOTE — CV Procedure (Deleted)
PROCEDURE:  Left heart catheterization with selective coronary angiography, left ventriculogram.  CLINICAL HISTORY:  This is a 63 year old man with diabetes, hypertension and known coronary artery disease has exertional dyspnea and abnormal nuclear stress test.  The risks, benefits, and details of the procedure were explained to the patient.  The patient verbalized understanding and wanted to proceed.  Informed written consent was obtained.  PROCEDURE TECHNIQUE:  The patient was approached from the right femoral artery using a 5 French short sheath.  Left coronary angiography was done using a Judkins L4 guide catheter.  Right coronary angiography was done using a Judkins R4 guide catheter.  Left ventriculography was done using a pigtail catheter.    CONTRAST:  Total of 47 cc.  COMPLICATIONS:  None.  At the end of the procedure a manual pressure device was used for hemostasis.    HEMODYNAMICS:  Aortic pressure was 143/75; LV pressure was 145/1; LVEDP 12.  There was no gradient between the left ventricle and aorta.    ANGIOGRAM/CORONARY ARTERIOGRAM:   The left main coronary artery is unremarkable.  The left anterior descending artery has luminal irregularities..  The left circumflex artery is dominant and has luminal irregularities.  The right coronary artery is small and has luminal irregularities.  LEFT VENTRICULOGRAM:  Left ventricular angiogram was done in the 30 RAO projection and revealed normal left ventricular wall motion and systolic function with an estimated ejection fraction of 60%.  LVEDP was 12 mmHg.  IMPRESSION OF HEART CATHETERIZATION:   1. Normal left main coronary artery. 2. Minimal disease of left anterior descending artery and its branches. 3. Minimal disease of dominantleft circumflex artery and its branches. 4. Minimal disease of right coronary artery. Normal left ventricular systolic function.  LVEDP 12 mmHg.  Ejection fraction 60 %.  RECOMMENDATION:   Medical  therapy with lifestyle modification.

## 2013-09-28 NOTE — Discharge Instructions (Signed)
Coronary Artery Bypass Grafting, Care After °Refer to this sheet in the next few weeks. These instructions provide you with information on caring for yourself after your procedure. Your health care provider may also give you more specific instructions. Your treatment has been planned according to current medical practices, but problems sometimes occur. Call your health care provider if you have any problems or questions after your procedure. °WHAT TO EXPECT AFTER THE PROCEDURE °Recovery from surgery will be different for everyone. Some people feel well after 3 or 4 weeks, while for others it takes longer. After your procedure, it is typical to have the following: °· Nausea and a lack of appetite.   °· Constipation. °· Weakness and fatigue.   °· Depression or irritability.   °· Pain or discomfort at your incision site. °HOME CARE INSTRUCTIONS °· Only take over-the-counter or prescription medicines as directed by your health care provider. Take all medicines exactly as directed. Do not stop taking medicines or start any new medicines without first checking with your health care provider.   °· Take your pulse as directed by your health care provider. °· Perform deep breathing as directed by your health care provider. If you were given a device called an incentive spirometer, use it to practice deep breathing several times a day. Support your chest with a pillow or your arms when you take deep breaths or cough. °· Keep incision areas clean, dry, and protected. Remove or change any bandages (dressings) only as directed by your health care provider. You may have skin adhesive strips over the incision areas. Do not take the strips off. They will fall off on their own. °· Check incision areas daily for any swelling, redness, or drainage. °· If incisions were made in your legs, do the following: °· Avoid crossing your legs.   °· Avoid sitting for long periods of time. Change positions every 30 minutes.   °· Elevate your legs  when you are sitting.   °· Wear compression stockings as directed by your health care provider. These stockings help keep blood clots from forming in your legs. °· Take showers once your health care provider approves. Until then, only take sponge baths. Pat incisions dry. Do not rub incisions with a washcloth or towel. Do not take tub baths or go swimming until your health care provider approves. °· Eat foods that are high in fiber, such as raw fruits and vegetables, whole grains, beans, and nuts. Meats should be lean cut. Avoid canned, processed, and fried foods. °· Drink enough fluids to keep your urine clear or pale yellow. °· Weigh yourself every day. This helps identify if you are retaining fluid that may make your heart and lungs work harder.   °· Rest and limit activity as directed by your health care provider. You may be instructed to: °· Stop any activity at once if you have chest pain, shortness of breath, irregular heartbeats, or dizziness. Get help right away if you have any of these symptoms. °· Move around frequently for short periods or take short walks as directed by your health care provider. Increase your activities gradually. You may need physical therapy or cardiac rehabilitation to help strengthen your muscles and build your endurance. °· Avoid lifting, pushing, or pulling anything heavier than 10 lb (4.5 kg) for at least 6 weeks after surgery. °· Do not drive until your health care provider approves.  °· Ask your health care provider when you may return to work and resume sexual activity. °· Follow up with your health care provider as   directed.   °SEEK MEDICAL CARE IF: °· You have swelling, redness, increasing pain, or drainage at the site of an incision.   °· You develop a fever.   °· You have swelling in your ankles or legs.   °· You have pain in your legs.   °· You have weight gain of 2 or more pounds a day. °· You are nauseous or vomit. °· You have diarrhea.  °SEEK IMMEDIATE MEDICAL CARE  IF: °· You have chest pain that goes to your jaw or arms. °· You have shortness of breath.   °· You have a fast or irregular heartbeat.   °· You notice a "clicking" in your breastbone (sternum) when you move.   °· You have numbness or weakness in your arms or legs. °· You feel dizzy or lightheaded.   °MAKE SURE YOU: °· Understand these instructions. °· Will watch your condition. °· Will get help right away if you are not doing well or get worse. °Document Released: 01/05/2005 Document Revised: 02/18/2013 Document Reviewed: 11/25/2012 °ExitCare® Patient Information ©2014 [employer]. ° °Endoscopic Saphenous Vein Harvesting °Care After °Refer to this sheet in the next few weeks. These instructions provide you with information on caring for yourself after your procedure. Your caregiver may also give you more specific instructions. Your treatment has been planned according to current medical practices, but problems sometimes occur. Call your caregiver if you have any problems or questions after your procedure. °HOME CARE INSTRUCTIONS °Medicine °· Take whatever pain medicine your surgeon prescribes. Follow the directions carefully. Do not take over-the-counter pain medicine unless your surgeon says it is okay. Some pain medicine can cause bleeding problems for several weeks after surgery. °· Follow your surgeon's instructions about driving. You will probably not be permitted to drive after heart surgery. °· Take any medicines your surgeon prescribes. Any medicines you took before your heart surgery should be checked with your caregiver before you start taking them again. °Wound care °· Ask your surgeon how long you should keep wearing your elastic bandage or stocking. °· Check the area around your surgical cuts (incisions) whenever your bandages (dressings) are changed. Look for any redness or swelling. °· You will need to return to have the stitches (sutures) or staples taken out. Ask your surgeon when to do  that. °· Ask your surgeon when you can shower or bathe. °Activity °· Try to keep your legs raised when you are sitting. °· Do any exercises your caregivers have given you. These may include deep breathing exercises, coughing, walking, or other exercises. °SEEK MEDICAL CARE IF: °· You have any questions about your medicines. °· You have more leg pain, especially if your pain medicine stops working. °· New or growing bruises develop on your leg. °· Your leg swells, feels tight, or becomes red. °· You have numbness in your leg. °SEEK IMMEDIATE MEDICAL CARE IF: °· Your pain gets much worse. °· Blood or fluid leaks from any of the incisions. °· Your incisions become warm, swollen, or red. °· You have chest pain. °· You have trouble breathing. °· You have a fever. °· You have more pain near your leg incision. °MAKE SURE YOU: °· Understand these instructions. °· Will watch your condition. °· Will get help right away if you are not doing well or get worse. °Document Released: 02/28/2011 Document Revised: 09/10/2011 Document Reviewed: 02/28/2011 °ExitCare® Patient Information ©2014 [employer]. ° ° °

## 2013-09-28 NOTE — Progress Notes (Addendum)
Removed EPW per MD orders and protocol; wires intact; pt on bedrest for one hour; q15 vitals for one hr; family at bedside; call bell within reach; will continue to monitor.  Carollee Sires, RN

## 2013-09-28 NOTE — Progress Notes (Signed)
CARDIAC REHAB PHASE I   PRE:  Rate/Rhythm: 109 ST    BP: sitting 122/70    SaO2: 98 2 1/2 L, 93 RA  MODE:  Ambulation: 350 ft   POST:  Rate/Rhythm: 123 ST    BP: sitting 130/60     SaO2: 94 RA  Pt HR elevated, BP stable. Steady with RW, no c/o. Return to recliner. Encouraged to walk with wife and use IS. [phone]   Josephina Shih Downey CES, ACSM 09/28/2013 12:08 PM

## 2013-09-28 NOTE — Care Management Note (Signed)
    Page 1 of 1   09/29/2013     5:14:47 PM   CARE MANAGEMENT NOTE 09/29/2013  Patient:  ZAELYN, BARBARY   Account Number:  [id number]  Date Initiated:  09/28/2013  Documentation initiated by:  Kayley Zeiders  Subjective/Objective Assessment:   PT S/P CABG X 3 ON 3/26.  PTA, PT INDEPENDENT, LIVES WITH SPOUSE.     Action/Plan:   WILL FOLLOW FOR DC NEEDS AS PT PROGRESSES.   Anticipated DC Date:  09/29/2013   Anticipated DC Plan:  [place]  CM consult      Choice offered to / List presented to:             Status of service:  Completed, signed off Medicare Important Message given?   (If response is "NO", the following Medicare IM given date fields will be blank) Date Medicare IM given:   Date Additional Medicare IM given:    Discharge Disposition:  HOME/SELF CARE  Per UR Regulation:  Reviewed for med. necessity/level of care/duration of stay  If discussed at [place] of Stay Meetings, dates discussed:   09/29/2013    Comments:  09/29/13 Marvella Jenning,RN,BSN [phone] PT [place].  DENIES ANY NEEDS FOR HOME.

## 2013-09-28 NOTE — Progress Notes (Signed)
Utilization Review Completed.Symphani Eckstrom T3/30/2015  

## 2013-09-28 NOTE — Discharge Summary (Signed)
[address]       [place] [address]             [phone]       Eric Warren [birth year] 63 y.o. [id number]  09/24/2013   Melrose Nakayama, MD  Unstable angina [411.1]   HPI:  This is a 63 y.o. male who presented to the emergency department with complaints of chest pain. He described a sensation similar to something being stuck in his throat. He had additional symptoms of nausea and diaphoresis as well as the pain radiating to the back of his shoulders and arms. He became very anxious. He presented to the emergency department with the symptoms resolved but he did rule in for myocardial infarction with a troponin I of 1.32 . He was seen in cardiology consultation by Dr. Doylene Canard and was admitted for further evaluation and treatment. History reviewed. No pertinent past medical history.  History reviewed. No pertinent past surgical history.  No family history on file.  Social History: reports that he has never smoked. He does not have any smokeless tobacco history on file. He reports that he does not drink alcohol or use illicit drugs.  Allergies: No Known Allergies  No prescriptions prior to admission    Hospital Course:   The patient was admitted for further evaluation and treatment to include cardiac catheterization. On 09/25/2013 was taken to the cardiac catheterization lab and the following results were noted:  IMPRESSION OF HEART CATHETERIZATION:  1. Normal left main coronary artery is very short. 2. 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 3. Moderate to severe osteal disease left circumflex artery and small branches. 4. Mild disease of dominant right coronary artery. 5. Normal left ventricular systolic function. LVEDP 16 mmHg. Ejection fraction 60-70 %. 6. Patent LIMA on left subclavian injection. These findings, cardiothoracic surgical consultation was obtained with Modesto Charon M.D. who  evaluated the patient and his studies and agreed with recommendations to proceed with coronary artery surgical revascularization. He also was taken to the operating room on the same date and underwent the following procedure:  DATE OF PROCEDURE: 09/25/2013  DATE OF DISCHARGE:  OPERATIVE REPORT  PREOPERATIVE DIAGNOSIS: Severe two-vessel coronary artery disease,  status post non-Q-wave myocardial infarction.  POSTOPERATIVE DIAGNOSIS: Severe two-vessel coronary artery disease,  status post non-Q-wave myocardial infarction.  PROCEDURE: Median sternotomy, extracorporeal circulation, coronary  artery bypass grafting x3 (left internal mammary artery to left anterior  descending, sequential saphenous vein graft to first diagonal and obtuse  marginal 1), endoscopic vein harvest, right thigh.  SURGEON: Revonda Standard. Roxan Hockey, MD  ASSISTANT: Lars Pinks, PA  ANESTHESIA: General.  FINDINGS: Good quality conduits, good quality targets. Transesophageal  echocardiography revealed preserved left ventricular wall motion, with  no valvular pathology. Patient tolerated procedure well was taken to the surgical intensive care unit in stable condition.  Postoperative hospital course:  Overall the patient has done quite nicely. He was weaned from the ventilator without difficulty. He is neurologically intact. All routine lines, monitors and drainage devices have been discontinued in the standard fashion. He had a moderate postoperative volume overload but has responded well to diuretics. He has maintained normal sinus rhythm. Oxygen has been weaned and he good saturations on room air. Incisions are healing well without evidence of infection. He is tolerating gradually increasing activities using standard postoperative cardiac rehabilitation phase 1 modalities. He has a mild postoperative acute blood loss anemia which is  stable. Function is normal. Blood sugars are under good control. Dr. Roxan Hockey saw and  evaluated the patient this morning and he is surgically stable for discharge today.     Recent Labs  09/27/13 0355 09/28/13 0430  NA 134* 139  K 4.1 4.4  CL 98 100  CO2 26 27  GLUCOSE 118* 109*  BUN 14 16  CALCIUM 8.5 8.8    Recent Labs  09/27/13 0355 09/28/13 0430  WBC 16.2* 13.5*  HGB 12.6* 12.1*  HCT 35.2* 34.6*  PLT 163 169   No results found for this basename: INR,  in the last 72 hours   Discharge Instructions:  The patient is discharged to home with extensive instructions on wound care and progressive ambulation.  They are instructed not to drive or perform any heavy lifting until returning to see the physician in his office.  Discharge Diagnosis:  Unstable angina [411.1]  Secondary Diagnosis: Patient Active Problem List   Diagnosis Date Noted  . Chest pain at rest 09/25/2013  . CAD (coronary artery disease) 09/25/2013   History reviewed. No pertinent past medical history.  Medications on discharge:   Medication List         aspirin 325 MG EC tablet  Take 1 tablet (325 mg total) by mouth daily.     beta carotene w/minerals tablet  Take 1 tablet by mouth daily.     GLUCOSAMINE PO  Take 1 tablet by mouth daily.     lisinopril 5 MG tablet  Commonly known as:  PRINIVIL,ZESTRIL  Take 1 tablet (5 mg total) by mouth daily.     metoprolol 50 MG tablet  Commonly known as:  LOPRESSOR  Take 1 tablet (50 mg total) by mouth 2 (two) times daily.     multivitamin with minerals Tabs tablet  Take 1 tablet by mouth daily.     nepafenac 0.1 % ophthalmic suspension  Commonly known as:  [place] 1 drop into both eyes 4 (four) times a week.     oxyCODONE 5 MG immediate release tablet  Commonly known as:  Oxy IR/ROXICODONE  Take 1-2 tablets (5-10 mg total) by mouth every 4 (four) hours as needed for moderate pain.     simvastatin 20 MG tablet  Commonly known as:  ZOCOR  Take 1 tablet (20 mg total) by mouth daily at 6 PM.       Follow-up  Information   Follow up with Melrose Nakayama, MD On 10/27/2013. (Appointment is at 1:00)    Specialty:  Cardiothoracic Surgery   Contact information:   [address] [address] [phone]       Follow up with  [facility] On 10/27/2013. (Please get CXR at 12:00)    Contact information:   [place]       Follow up with [facility] S, MD. (Please contact office to set up 2 week hospital follow up)    Specialty:  Cardiology   Contact information:   [place] [address] [phone]       The patient has been discharged on:   1.Beta Blocker:  Yes [ x  ]                              No   [   ]  If No, reason:  2.Ace Inhibitor/ARB: Yes [  x ]                                     No  [    ]                                     If No, reason:  3.Statin:   Yes [ x  ]                  No  [   ]                  If No, reason:  4.Ecasa:  Yes  [ x  ]                  No   [   ]                  If No, reason:   Disposition: Stable  Patient's condition is [place], PA-C 09/29/2013  8:36 AM

## 2013-09-29 ENCOUNTER — Encounter (HOSPITAL_COMMUNITY): Payer: Self-pay | Admitting: Thoracic Surgery (Cardiothoracic Vascular Surgery)

## 2013-09-29 MED ORDER — OXYCODONE HCL 5 MG PO TABS: 5.0000 mg | ORAL_TABLET | ORAL | Status: DC | PRN

## 2013-09-29 MED ORDER — LISINOPRIL 5 MG PO TABS: 5.0000 mg | ORAL_TABLET | Freq: Every day | ORAL | Status: DC

## 2013-09-29 MED ORDER — SIMVASTATIN 20 MG PO TABS: 20.0000 mg | ORAL_TABLET | Freq: Every day | ORAL | Status: DC

## 2013-09-29 MED ORDER — METOPROLOL TARTRATE 50 MG PO TABS: 50.0000 mg | ORAL_TABLET | Freq: Two times a day (BID) | ORAL | Status: DC

## 2013-09-29 MED ORDER — ASPIRIN 325 MG PO TBEC: 325.0000 mg | DELAYED_RELEASE_TABLET | Freq: Every day | ORAL | Status: DC

## 2013-09-29 MED ORDER — METOPROLOL TARTRATE 50 MG PO TABS
50.0000 mg | ORAL_TABLET | Freq: Two times a day (BID) | ORAL | Status: DC
  Administered 2013-09-29: 50 mg via ORAL
  Filled 2013-09-29 (×2): qty 1

## 2013-09-29 MED FILL — Sodium Bicarbonate IV Soln 8.4%: INTRAVENOUS | Qty: 50 | Status: AC

## 2013-09-29 MED FILL — Heparin Sodium (Porcine) Inj 1000 Unit/ML: INTRAMUSCULAR | Qty: 10 | Status: AC

## 2013-09-29 MED FILL — Mannitol IV Soln 20%: INTRAVENOUS | Qty: 500 | Status: AC

## 2013-09-29 MED FILL — Sodium Chloride IV Soln 0.9%: INTRAVENOUS | Qty: 2000 | Status: AC

## 2013-09-29 MED FILL — Electrolyte-R (PH 7.4) Solution: INTRAVENOUS | Qty: 3000 | Status: AC

## 2013-09-29 MED FILL — Lidocaine HCl IV Inj 20 MG/ML: INTRAVENOUS | Qty: 5 | Status: AC

## 2013-09-29 NOTE — Progress Notes (Addendum)
      [address]       [place] [address]             [phone]        4 Days Post-Op Procedure(s) (LRB): CORONARY ARTERY BYPASS GRAFTING (CABG) (N/A) INTRAOPERATIVE TRANSESOPHAGEAL ECHOCARDIOGRAM (N/A)  Subjective: Patient without specific complaints. He has had a bowel movement and is now getting loose movements.  Objective: Vital signs in last 24 hours: Temp:  [98.1 F (36.7 C)-98.7 F (37.1 C)] 98.5 F (36.9 C) (03/31 0607) Pulse Rate:  [91-123] 96 (03/31 0607) Cardiac Rhythm:  [-] Normal sinus rhythm (03/30 2130) Resp:  [17] 17 (03/31 0607) BP: (108-130)/(60-76) 125/76 mmHg (03/31 0607) SpO2:  [90 %-98 %] 90 % (03/31 0607) Weight:  [197 lb 12 oz (89.7 kg)] 197 lb 12 oz (89.7 kg) (03/31 0607)  Pre op weight 89 kg Current Weight  09/29/13 197 lb 12 oz (89.7 kg)      Intake/Output from previous day: 03/30 0701 - 03/31 0700 In: 180 [P.O.:180] Out: 200 [Urine:200]   Physical Exam:  Cardiovascular: RRR Pulmonary: Slightly diminished at bases; no rales, wheezes, or rhonchi. Abdomen: Soft, non tender, bowel sounds present. Extremities: Trace bilateral lower extremity edema. Wounds: Clean and dry.  No erythema or signs of infection.  Lab Results: CBC: Recent Labs  09/27/13 0355 09/28/13 0430  WBC 16.2* 13.5*  HGB 12.6* 12.1*  HCT 35.2* 34.6*  PLT 163 169   BMET:  Recent Labs  09/27/13 0355 09/28/13 0430  NA 134* 139  K 4.1 4.4  CL 98 100  CO2 26 27  GLUCOSE 118* 109*  BUN 14 16  CREATININE 0.80 0.80  CALCIUM 8.5 8.8    PT/INR:  Lab Results  Component Value Date   INR 1.27 09/25/2013   INR 0.96 09/25/2013   ABG:  INR: Will add last result for INR, ABG once components are confirmed Will add last 4 CBG results once components are confirmed  Assessment/Plan:  1. CV - ST in the low 100's. On Lopressor 37.5 bid and Lisinopril 5 daily. Will increase Lopressor to 50 bid. 2.  Pulmonary - Encourage incentive spirometer 3.   Acute blood loss anemia - H and H stable at 12.1 and 34.6 4. Remove sutures 5. Stop stool softeners 6. Discharge   ZIMMERMAN,DONIELLE MPA-C 09/29/2013,7:38 AM  Patient seen and examined, agree with above Home today

## 2013-09-29 NOTE — Progress Notes (Signed)
Went over discharge with patient, wife at bedside. No additional questions or concerns related to discharge instruction. IV d/c, patient taken off cardiac monitor. Elmarie Shiley R

## 2013-09-29 NOTE — Progress Notes (Signed)
Ed completed with pt and wife. Voiced understanding and requests his name be sent to [place], ACSM 9:49 AM 09/29/2013

## 2013-10-06 ENCOUNTER — Ambulatory Visit
Admission: RE | Admit: 2013-10-06 | Discharge: 2013-10-06 | Disposition: A | Discharge status: Home / Self Care | Payer: BC Managed Care – PPO | Source: Ambulatory Visit | Attending: Cardiovascular Disease | Admitting: Cardiovascular Disease

## 2013-10-06 ENCOUNTER — Other Ambulatory Visit: Payer: Self-pay | Admitting: Cardiovascular Disease

## 2013-10-06 DIAGNOSIS — Z951 Presence of aortocoronary bypass graft: Secondary | ICD-10-CM

## 2013-10-06 DIAGNOSIS — R0789 Other chest pain: Secondary | ICD-10-CM

## 2013-10-07 NOTE — Addendum Note (Signed)
Addendum created 10/07/13 1420 by Laurie Panda, MD   Modules edited: Anesthesia LDA

## 2013-10-26 ENCOUNTER — Other Ambulatory Visit: Payer: Self-pay | Admitting: Thoracic Surgery (Cardiothoracic Vascular Surgery)

## 2013-10-26 DIAGNOSIS — R079 Chest pain, unspecified: Secondary | ICD-10-CM

## 2013-10-27 ENCOUNTER — Encounter: Payer: Self-pay | Admitting: Thoracic Surgery (Cardiothoracic Vascular Surgery)

## 2013-10-27 ENCOUNTER — Ambulatory Visit (INDEPENDENT_AMBULATORY_CARE_PROVIDER_SITE_OTHER): Payer: Self-pay | Admitting: Thoracic Surgery (Cardiothoracic Vascular Surgery)

## 2013-10-27 ENCOUNTER — Ambulatory Visit
Admission: RE | Admit: 2013-10-27 | Discharge: 2013-10-27 | Disposition: A | Discharge status: Home / Self Care | Payer: BC Managed Care – PPO | Source: Ambulatory Visit | Attending: Thoracic Surgery (Cardiothoracic Vascular Surgery) | Admitting: Thoracic Surgery (Cardiothoracic Vascular Surgery)

## 2013-10-27 VITALS — BP 134/89 | HR 88 | Resp 20 | Ht 70.0 in | Wt 181.0 lb

## 2013-10-27 DIAGNOSIS — G8918 Other acute postprocedural pain: Secondary | ICD-10-CM

## 2013-10-27 DIAGNOSIS — Z951 Presence of aortocoronary bypass graft: Secondary | ICD-10-CM

## 2013-10-27 DIAGNOSIS — R079 Chest pain, unspecified: Secondary | ICD-10-CM

## 2013-10-27 DIAGNOSIS — I2510 Atherosclerotic heart disease of native coronary artery without angina pectoris: Secondary | ICD-10-CM

## 2013-10-27 MED ORDER — OXYCODONE HCL 5 MG PO TABS: 5.0000 mg | ORAL_TABLET | Freq: Four times a day (QID) | ORAL | Status: DC | PRN

## 2013-10-27 MED ORDER — PREDNISONE (PAK) 10 MG PO TABS
ORAL_TABLET | ORAL | Status: DC
Start: 2013-10-27 — End: 2013-11-17

## 2013-10-27 NOTE — Progress Notes (Signed)
HPI:  Mr. Eric Warren returns today for scheduled postoperative followup visit.  He is a 63 year old gentleman who presented with a non-ST elevation MI in March. He had coronary bypass grafting x3 on March 27. Postoperatively he did well and was discharged on postoperative day #4.  He saw Dr. Doylene Canard. A chest x-ray showed a left pleural effusion. He was started on Lasix 20 mg daily and potassium 10 mEq daily.  He is having some incisional pain. His major complaint is fatigue and lack of energy. He is walking twice daily about 10 minutes at a time before having to rest. He has not had any anginal pain, shortness of breath, or swelling in his legs.  No past medical history on file.    Current Outpatient Prescriptions  Medication Sig Dispense Refill  . acetaminophen (TYLENOL) 500 MG tablet Take 500 mg by mouth every 6 (six) hours as needed.      Marland Kitchen aspirin EC 325 MG EC tablet Take 1 tablet (325 mg total) by mouth daily.  30 tablet  0  . beta carotene w/minerals (OCUVITE) tablet Take 1 tablet by mouth daily.      . furosemide (LASIX) 20 MG tablet Take 20 mg by mouth daily.      Marland Kitchen lisinopril (PRINIVIL,ZESTRIL) 5 MG tablet Take 1 tablet (5 mg total) by mouth daily.  30 tablet  0  . metoprolol (LOPRESSOR) 50 MG tablet Take 1 tablet (50 mg total) by mouth 2 (two) times daily.  60 tablet  1  . nepafenac (NEVANAC) 0.1 % ophthalmic suspension Place 1 drop into both eyes 4 (four) times a week.      Marland Kitchen oxyCODONE (OXY IR/ROXICODONE) 5 MG immediate release tablet Take 1-2 tablets (5-10 mg total) by mouth every 6 (six) hours as needed for moderate pain.  40 tablet  0  . potassium chloride (MICRO-K) 10 MEQ CR capsule Take 10 mEq by mouth daily.      . simvastatin (ZOCOR) 20 MG tablet Take 1 tablet (20 mg total) by mouth daily at 6 PM.  30 tablet  1  . predniSONE (STERAPRED UNI-PAK) 10 MG tablet Take 5 tablets by mouth 1 day 1, 4 tablets on day 2, 3 tablets a day 3, 2 tablets on day 4, 1 tablet Monday 5, then  discontinue  15 tablet  0   No current facility-administered medications for this visit.    Physical Exam BP 134/89  Pulse 88  Resp 20  Ht 5\' 10"  (1.778 m)  Wt 181 lb (82.101 kg)  BMI 25.97 kg/m2  SpO78 9% 63 year old man in no acute distress Well-developed and well-nourished Alert and oriented x3 with no focal deficits Lungs diminished breath sounds at left base otherwise clear Cardiac regular rate and rhythm, normal S1 and S2 Sternum stable, incision clean dry and intact Leg incision healing well, no peripheral edema  Diagnostic Tests: Chest x-ray 10/27/2013 FINDINGS:  There has been some decrease in volume of the small left pleural  effusion. Opacity remains at the left lung base which may represent  atelectasis, with pneumonia difficult to exclude. The right lung is  clear. Heart size is stable.  IMPRESSION:  Decrease in volume of the left pleural effusion with little change  in left basilar opacity most consistent with atelectasis. Pneumonia  cannot be excluded.  Electronically Signed  By: Ivar Drape M.D.  On: 10/27/2013 12:31   Impression: 63 year old gentleman who had coronary bypass grafting x3 on March 27 after presenting with a non-ST elevation MI.  Overall this point in time he is doing well. He is still having a fatigue which is to be expected. He does have a left pleural effusion. This has improved but not resolved with diuretics. I think he might benefit from a steroid taper with prednisone 50 mg tapering to 10 mg over 5 days.  I did give him a prescription for oxycodone 5-10 mg by mouth 4 times daily as needed for pain.  He may begin driving a limited basis, appropriate precautions were discussed. He is not to lift anything over 10 pounds for another 2 weeks. Beyond that his activities are unrestricted, but he should build into new activities gradually.  Plan:  I will see him back in 3 weeks with a repeat PA and lateral chest x-ray to check on the left  pleural effusion as well as his overall progress.

## 2013-10-28 NOTE — Addendum Note (Signed)
Addendum created 10/28/13 0908 by Laurie Panda, MD   Modules edited: Anesthesia Blocks and Procedures, Anesthesia LDA, Clinical Notes   Clinical Notes:  File: [id number]

## 2013-10-29 ENCOUNTER — Other Ambulatory Visit: Payer: Self-pay

## 2013-10-29 DIAGNOSIS — I10 Essential (primary) hypertension: Secondary | ICD-10-CM

## 2013-10-29 MED ORDER — LISINOPRIL 5 MG PO TABS: 5.0000 mg | ORAL_TABLET | Freq: Every day | ORAL | Status: DC

## 2013-10-29 NOTE — Telephone Encounter (Signed)
RX refill for Lisinopril sent to pharm

## 2013-11-16 ENCOUNTER — Other Ambulatory Visit: Payer: Self-pay | Admitting: Thoracic Surgery (Cardiothoracic Vascular Surgery)

## 2013-11-16 DIAGNOSIS — R079 Chest pain, unspecified: Secondary | ICD-10-CM

## 2013-11-17 ENCOUNTER — Ambulatory Visit (INDEPENDENT_AMBULATORY_CARE_PROVIDER_SITE_OTHER): Payer: BC Managed Care – PPO | Admitting: Interventional Cardiology

## 2013-11-17 ENCOUNTER — Encounter: Payer: Self-pay | Admitting: Interventional Cardiology

## 2013-11-17 ENCOUNTER — Ambulatory Visit (INDEPENDENT_AMBULATORY_CARE_PROVIDER_SITE_OTHER): Payer: Self-pay | Admitting: Thoracic Surgery (Cardiothoracic Vascular Surgery)

## 2013-11-17 ENCOUNTER — Ambulatory Visit
Admission: RE | Admit: 2013-11-17 | Discharge: 2013-11-17 | Disposition: A | Discharge status: Home / Self Care | Payer: Self-pay | Source: Ambulatory Visit | Attending: Thoracic Surgery (Cardiothoracic Vascular Surgery) | Admitting: Thoracic Surgery (Cardiothoracic Vascular Surgery)

## 2013-11-17 ENCOUNTER — Encounter: Payer: Self-pay | Admitting: Thoracic Surgery (Cardiothoracic Vascular Surgery)

## 2013-11-17 VITALS — BP 132/80 | HR 68 | Ht 70.0 in | Wt 177.0 lb

## 2013-11-17 VITALS — BP 134/91 | HR 73 | Resp 16 | Ht 70.0 in | Wt 178.5 lb

## 2013-11-17 DIAGNOSIS — R079 Chest pain, unspecified: Secondary | ICD-10-CM

## 2013-11-17 DIAGNOSIS — I2510 Atherosclerotic heart disease of native coronary artery without angina pectoris: Secondary | ICD-10-CM

## 2013-11-17 DIAGNOSIS — E785 Hyperlipidemia, unspecified: Secondary | ICD-10-CM

## 2013-11-17 DIAGNOSIS — Z951 Presence of aortocoronary bypass graft: Secondary | ICD-10-CM

## 2013-11-17 DIAGNOSIS — I10 Essential (primary) hypertension: Secondary | ICD-10-CM | POA: Insufficient documentation

## 2013-11-17 NOTE — Patient Instructions (Addendum)
Your physician has recommended you make the following change in your medication:  1) STOP LASIX 2) STOP POTASSIUM  You have been referred to [place]  Your physician recommends that you return for a FASTING lipid profile and alt on 11/18/13 between 7:30am-5:15pm  You have a follow up appointment scheduled for 02/09/14 @ 9 am

## 2013-11-17 NOTE — Progress Notes (Signed)
HPI:  Eric Warren returns today for a second postoperative followup visit. He had a non-Q-wave MI in March and underwent coronary bypass grafting x3 on March 27. I saw him in the office on April 28 at which time he was doing well does still have moderate effusion. He also is very sore and tired and generally felt fatigued.  We treated him with a steroid taper and he now returns for followup.  He says that he is starting to feel better. He still gets tired more easily than he did preoperatively. He still is sore in his chest and collarbones, but he has not had to take any oxycodone for the past week or so. He does sometimes have a cough late in the day after he eats.  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 acetaminophen (TYLENOL) 500 MG tablet Take 500 mg by mouth every 6 (six) hours as needed.      Marland Kitchen aspirin EC 325 MG EC tablet Take 1 tablet (325 mg total) by mouth daily.  30 tablet  0  . beta carotene w/minerals (OCUVITE) tablet Take 1 tablet by mouth daily.      Marland Kitchen lisinopril (PRINIVIL,ZESTRIL) 5 MG tablet Take 1 tablet (5 mg total) by mouth daily.  30 tablet  0  . metoprolol (LOPRESSOR) 50 MG tablet Take 1 tablet (50 mg total) by mouth 2 (two) times daily.  60 tablet  1  . nepafenac (NEVANAC) 0.1 % ophthalmic suspension Place 1 drop into both eyes 4 (four) times a week.      Marland Kitchen oxyCODONE (OXY IR/ROXICODONE) 5 MG immediate release tablet Take 1-2 tablets (5-10 mg total) by mouth every 6 (six) hours as needed for moderate pain.  40 tablet  0  . potassium chloride (MICRO-K) 10 MEQ CR capsule Take 10 mEq by mouth daily.      . simvastatin (ZOCOR) 20 MG tablet Take 1 tablet (20 mg total) by mouth daily at 6 PM.  30 tablet  1  . furosemide (LASIX) 20 MG tablet Take 20 mg by mouth daily.       No current facility-administered medications for this visit.    Physical  Exam BP 134/91  Pulse 73  Resp 16  Ht 5\' 10"  (1.778 m)  Wt 178 lb 8 oz (80.967 kg)  BMI 25.61 kg/m2  SpO4 63% 63 year old man in no acute distress Alert and oriented x3 with no focal neurologic deficits Lungs clear with equal breath sounds bilaterally Cardiac regular rate and rhythm normal S1-S2 Sternum stable Incisions healing well  Diagnostic Tests: CHEST 2 VIEW  COMPARISON: October 27, 2013  FINDINGS:  There is no edema or consolidation. The heart size and pulmonary  vascularity are normal. No adenopathy. Patient is status post  coronary artery bypass grafting. No bone lesions.  IMPRESSION:  No edema or consolidation.   Electronically Signed  By: Lowella Grip M.D.  On: 11/17/2013 14:13    Impression: 63 year old gentleman who is now about 6 weeks post coronary bypass grafting for two-vessel disease following a non-Q-wave MI. He is doing well. He does seem rather anxious about his activities and whether he is recovering rapidly enough. I tried to reassure him that he is doing just fine and will continue to improve over the next couple of months.  His left pleural effusion has resolved.  At this point he is driving. There are no  restrictions on his activities in regards to the amount of weight he can lift, but he should build into activities gradually. I recommended that he wait about another month before trying to play golf.  He has been working on a part-time basis  Plan:  I will be happy see him back at any time if I can be of any further assistance with his care.

## 2013-11-17 NOTE — Progress Notes (Signed)
Patient ID: Eric Warren, male   DOB: 10-Aug-1950, 63 y.o.   MRN: [id number]   Date: 11/17/2013 ID: Eric Warren, DOB [birth year], MRN [id number] PCP: Vena Austria, MD  Reason: To establish for long-term cardiovascular management  ASSESSMENT;  1. CAD with recent coronary bypass grafting 2. Hyperlipidemia 3. Hypertension   PLAN:  1. Fasting lipid and ALT 2. Enroll in phase II cardiac rehabilitation 3. Increase physical activity 4. Continue all other medications as listed 5. Low salt low fat diet   SUBJECTIVE: Eric Warren is a 63 y.o. male who is status post recent coronary bypass grafting approximately 8 weeks ago.Marland Kitchen He denies chest pain. He has no prior history of cardiovascular disease. He denies a history of hypertension and diabetes.   No Known Allergies  Current Outpatient Prescriptions on File Prior to Visit  Medication Sig Dispense Refill  . acetaminophen (TYLENOL) 500 MG tablet Take 500 mg by mouth every 6 (six) hours as needed.      Marland Kitchen aspirin EC 325 MG EC tablet Take 1 tablet (325 mg total) by mouth daily.  30 tablet  0  . beta carotene w/minerals (OCUVITE) tablet Take 1 tablet by mouth daily.      . furosemide (LASIX) 20 MG tablet Take 20 mg by mouth daily.      Marland Kitchen lisinopril (PRINIVIL,ZESTRIL) 5 MG tablet Take 1 tablet (5 mg total) by mouth daily.  30 tablet  0  . metoprolol (LOPRESSOR) 50 MG tablet Take 1 tablet (50 mg total) by mouth 2 (two) times daily.  60 tablet  1  . nepafenac (NEVANAC) 0.1 % ophthalmic suspension Place 1 drop into both eyes 4 (four) times a week.      Marland Kitchen oxyCODONE (OXY IR/ROXICODONE) 5 MG immediate release tablet Take 1-2 tablets (5-10 mg total) by mouth every 6 (six) hours as needed for moderate pain.  40 tablet  0  . simvastatin (ZOCOR) 20 MG tablet Take 1 tablet (20 mg total) by mouth daily at 6 PM.  30 tablet  1   No current facility-administered medications on file prior to visit.    Past Medical History  Diagnosis Date  .  Myocardial infarction     non-STEMI 08/2013, CABG x 3 09/25/2013    Past Surgical History  Procedure Laterality Date  . Coronary artery bypass graft N/A 09/25/2013    Procedure: CORONARY ARTERY BYPASS GRAFTING (CABG);  Surgeon: Melrose Nakayama, MD;  Location: [facility];  Service: Open Heart Surgery;  Laterality: N/A;  Times 3 using left internal mammary artery and endoscopically harvested right saphenous vein.  . Intraoperative transesophageal echocardiogram N/A 09/25/2013    Procedure: INTRAOPERATIVE TRANSESOPHAGEAL ECHOCARDIOGRAM;  Surgeon: Melrose Nakayama, MD;  Location: [facility];  Service: Open Heart Surgery;  Laterality: N/A;    History   Social History  . Marital Status: Married    Spouse Name: N/A    Number of Children: N/A  . Years of Education: N/A   Occupational History  . real estate    Social History Main Topics  . Smoking status: Never Smoker   . Smokeless tobacco: Not on file  . Alcohol Use: No  . Drug Use: No  . Sexual Activity: Yes   Other Topics Concern  . Not on file   Social History Narrative  . No narrative on file    Family History  Problem Relation Age of Onset  . Coronary artery disease Father 4    had a stent    ROS: Denies  claudication. No neurological complaints. Appetite is good. Denies chills and fever. No sternal complaints. No drainage from incisions. He denies lower extremity swelling and dyspnea.. Other systems negative for complaints.  OBJECTIVE: BP 132/80  Pulse 68  Ht 5\' 10"  (1.778 m)  Wt 177 lb (80.287 kg)  BMI 25.40 kg/m2,  General: No acute distress, appears healthy HEENT: normal without pallor or jaundice Neck: JVD flat. Carotids absent bruits Chest: Clear with mild reduction in breath sounds at the left base Cardiac: Murmur: Absent. Gallop: S4. Rhythm: Normal. Other: Absent Abdomen: Bruit: Absent. Pulsation: Absent Extremities: Edema: None. Pulses: 2+ Neuro: Normal Psych: Normal  ECG: Not repeated

## 2013-11-18 ENCOUNTER — Other Ambulatory Visit (INDEPENDENT_AMBULATORY_CARE_PROVIDER_SITE_OTHER): Payer: BC Managed Care – PPO

## 2013-11-18 DIAGNOSIS — E785 Hyperlipidemia, unspecified: Secondary | ICD-10-CM

## 2013-11-18 LAB — LIPID PANEL
Cholesterol: 156 mg/dL (ref 0–200)
HDL: 31.8 mg/dL — ABNORMAL LOW (ref >39.00)
LDL Cholesterol: 103 mg/dL — ABNORMAL HIGH (ref 0–99)
Total CHOL/HDL Ratio: 5
Triglycerides: 108 mg/dL (ref 0.0–149.0)
VLDL: 21.6 mg/dL (ref 0.0–40.0)

## 2013-11-18 LAB — ALT: ALT: 18 U/L (ref 0–53)

## 2013-11-27 ENCOUNTER — Other Ambulatory Visit: Payer: Self-pay | Admitting: *Deleted

## 2013-11-27 DIAGNOSIS — I10 Essential (primary) hypertension: Secondary | ICD-10-CM

## 2013-11-27 MED ORDER — LISINOPRIL 5 MG PO TABS: 5.0000 mg | ORAL_TABLET | Freq: Every day | ORAL | Status: DC

## 2013-11-30 ENCOUNTER — Other Ambulatory Visit: Payer: Self-pay | Admitting: *Deleted

## 2013-11-30 MED ORDER — METOPROLOL TARTRATE 50 MG PO TABS: 50.0000 mg | ORAL_TABLET | Freq: Two times a day (BID) | ORAL | Status: DC

## 2013-12-03 ENCOUNTER — Other Ambulatory Visit: Payer: Self-pay | Admitting: *Deleted

## 2013-12-03 MED ORDER — SIMVASTATIN 20 MG PO TABS: 20.0000 mg | ORAL_TABLET | Freq: Every day | ORAL | Status: DC

## 2013-12-04 ENCOUNTER — Telehealth: Payer: Self-pay

## 2013-12-04 DIAGNOSIS — E785 Hyperlipidemia, unspecified: Secondary | ICD-10-CM

## 2013-12-04 MED ORDER — SIMVASTATIN 40 MG PO TABS: 40.0000 mg | ORAL_TABLET | Freq: Every day | ORAL | Status: DC

## 2013-12-04 NOTE — Telephone Encounter (Signed)
Message copied by Lamar Laundry on Fri Dec 04, 2013  9:31 AM ------      Message from: Daneen Schick      Created: Wed Dec 02, 2013  5:12 PM       Labs are pretty good. Liver is okay. The LDL is still greater than 100.. We need to change  statin to Lipitor 40 mg daily. Recheck in 6-8 weeks a lipid and hepatic ------

## 2013-12-04 NOTE — Telephone Encounter (Signed)
pt given lab results and Dr.Smith instructions.Labs are pretty good. Liver is okay. The LDL is still greater than 100.. We need to change  statin 40 mg daily. Recheck in 6-8 weeks a lipid and hepatic. Rx for simvastatin 40mg  sent to pt pharmacy. lab appt made for 01/20/14.pt verbalized understanding

## 2013-12-10 ENCOUNTER — Encounter (HOSPITAL_COMMUNITY)
Admission: RE | Admit: 2013-12-10 | Discharge: 2013-12-10 | Disposition: A | Discharge status: Home / Self Care | Payer: BC Managed Care – PPO | Source: Ambulatory Visit | Attending: Interventional Cardiology | Admitting: Interventional Cardiology

## 2013-12-10 DIAGNOSIS — I10 Essential (primary) hypertension: Secondary | ICD-10-CM | POA: Insufficient documentation

## 2013-12-10 DIAGNOSIS — E785 Hyperlipidemia, unspecified: Secondary | ICD-10-CM | POA: Insufficient documentation

## 2013-12-10 DIAGNOSIS — I2510 Atherosclerotic heart disease of native coronary artery without angina pectoris: Secondary | ICD-10-CM | POA: Insufficient documentation

## 2013-12-10 DIAGNOSIS — Z5189 Encounter for other specified aftercare: Secondary | ICD-10-CM | POA: Insufficient documentation

## 2013-12-10 NOTE — Progress Notes (Signed)
Cardiac Rehab Medication Review by a Pharmacist  Does the patient  feel that his/her medications are working for him/her?  yes  Has the patient been experiencing any side effects to the medications prescribed?  no  Does the patient measure his/her own blood pressure or blood glucose at home?  no   Does the patient have any problems obtaining medications due to transportation or finances?   no  Understanding of regimen: good Understanding of indications: good Potential of compliance: fair    Pharmacist comments:  Eric Warren is a 63 yo M who presents today for his cardiac rehab orientation. He explains forgetting his medication every now and then. We talked about using an alarm to help remind him to take his medication, especially for the one that is twice daily. Otherwise he has no other issues and understands why he needs all his medication.  Eric Warren 12/10/2013 9:54 AM

## 2013-12-14 ENCOUNTER — Encounter (HOSPITAL_COMMUNITY): Payer: Self-pay

## 2013-12-14 ENCOUNTER — Encounter (HOSPITAL_COMMUNITY)
Admission: RE | Admit: 2013-12-14 | Discharge: 2013-12-14 | Disposition: A | Discharge status: Home / Self Care | Payer: BC Managed Care – PPO | Source: Ambulatory Visit | Attending: Interventional Cardiology | Admitting: Interventional Cardiology

## 2013-12-14 DIAGNOSIS — I10 Essential (primary) hypertension: Secondary | ICD-10-CM | POA: Diagnosis not present

## 2013-12-14 DIAGNOSIS — Z5189 Encounter for other specified aftercare: Secondary | ICD-10-CM | POA: Diagnosis not present

## 2013-12-14 DIAGNOSIS — E785 Hyperlipidemia, unspecified: Secondary | ICD-10-CM | POA: Diagnosis not present

## 2013-12-14 DIAGNOSIS — I2510 Atherosclerotic heart disease of native coronary artery without angina pectoris: Secondary | ICD-10-CM | POA: Diagnosis present

## 2013-12-14 NOTE — Progress Notes (Signed)
Pt started cardiac rehab today.  Pt tolerated light exercise without difficulty.  VSS, telemetry-NSR.  Asymptomatic.  PHQ-0.  Pt exhibits no psychosocial barriers to rehab participation.  Pt displays positive hopeful outlook with good coping skills and family support.  Pt states his father passed away earlier this year which was a loss however he displays normal grieving pattern.  Pt states his goals for rehab participation are to improve his health and lose weight.  Pt oriented to exercise equipment and routine.  Understanding verbalized.

## 2013-12-16 ENCOUNTER — Encounter (HOSPITAL_COMMUNITY)
Admission: RE | Admit: 2013-12-16 | Discharge: 2013-12-16 | Disposition: A | Discharge status: Home / Self Care | Payer: BC Managed Care – PPO | Source: Ambulatory Visit | Attending: Interventional Cardiology | Admitting: Interventional Cardiology

## 2013-12-16 DIAGNOSIS — Z5189 Encounter for other specified aftercare: Secondary | ICD-10-CM | POA: Diagnosis not present

## 2013-12-18 ENCOUNTER — Encounter (HOSPITAL_COMMUNITY)
Admission: RE | Admit: 2013-12-18 | Discharge: 2013-12-18 | Disposition: A | Discharge status: Home / Self Care | Payer: BC Managed Care – PPO | Source: Ambulatory Visit | Attending: Interventional Cardiology | Admitting: Interventional Cardiology

## 2013-12-18 DIAGNOSIS — Z5189 Encounter for other specified aftercare: Secondary | ICD-10-CM | POA: Diagnosis not present

## 2013-12-21 ENCOUNTER — Encounter (HOSPITAL_COMMUNITY)
Admission: RE | Admit: 2013-12-21 | Discharge: 2013-12-21 | Disposition: A | Discharge status: Home / Self Care | Payer: BC Managed Care – PPO | Source: Ambulatory Visit | Attending: Interventional Cardiology | Admitting: Interventional Cardiology

## 2013-12-21 DIAGNOSIS — Z5189 Encounter for other specified aftercare: Secondary | ICD-10-CM | POA: Diagnosis not present

## 2013-12-23 ENCOUNTER — Encounter (HOSPITAL_COMMUNITY)
Admission: RE | Admit: 2013-12-23 | Discharge: 2013-12-23 | Disposition: A | Discharge status: Home / Self Care | Payer: BC Managed Care – PPO | Source: Ambulatory Visit | Attending: Interventional Cardiology | Admitting: Interventional Cardiology

## 2013-12-23 DIAGNOSIS — Z5189 Encounter for other specified aftercare: Secondary | ICD-10-CM | POA: Diagnosis not present

## 2013-12-24 ENCOUNTER — Telehealth: Payer: Self-pay

## 2013-12-24 NOTE — Telephone Encounter (Signed)
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 adv her that pt medical hx will be reviewed with them at that consult. The surgeon office will contact our office regarding cardiac clearance/cardiovascular risk. Pt wife was upset and crying, reassurance was given, adv her to call the office if I can be of further assistance.pt wife was thankful and verbalized undetstanding

## 2013-12-25 ENCOUNTER — Encounter (HOSPITAL_COMMUNITY)
Admission: RE | Admit: 2013-12-25 | Discharge: 2013-12-25 | Disposition: A | Discharge status: Home / Self Care | Payer: BC Managed Care – PPO | Source: Ambulatory Visit | Attending: Interventional Cardiology | Admitting: Interventional Cardiology

## 2013-12-25 DIAGNOSIS — Z5189 Encounter for other specified aftercare: Secondary | ICD-10-CM | POA: Diagnosis not present

## 2013-12-28 ENCOUNTER — Encounter (HOSPITAL_COMMUNITY)
Admission: RE | Admit: 2013-12-28 | Discharge: 2013-12-28 | Disposition: A | Discharge status: Home / Self Care | Payer: BC Managed Care – PPO | Source: Ambulatory Visit | Attending: Interventional Cardiology | Admitting: Interventional Cardiology

## 2013-12-28 DIAGNOSIS — Z5189 Encounter for other specified aftercare: Secondary | ICD-10-CM | POA: Diagnosis not present

## 2013-12-30 ENCOUNTER — Encounter (HOSPITAL_COMMUNITY)
Admission: RE | Admit: 2013-12-30 | Discharge: 2013-12-30 | Disposition: A | Discharge status: Home / Self Care | Payer: BC Managed Care – PPO | Source: Ambulatory Visit | Attending: Interventional Cardiology | Admitting: Interventional Cardiology

## 2013-12-30 DIAGNOSIS — I214 Non-ST elevation (NSTEMI) myocardial infarction: Secondary | ICD-10-CM | POA: Insufficient documentation

## 2013-12-30 DIAGNOSIS — Z951 Presence of aortocoronary bypass graft: Secondary | ICD-10-CM | POA: Insufficient documentation

## 2014-01-04 ENCOUNTER — Encounter (HOSPITAL_COMMUNITY): Payer: BC Managed Care – PPO

## 2014-01-06 ENCOUNTER — Encounter (HOSPITAL_COMMUNITY): Payer: BC Managed Care – PPO

## 2014-01-08 ENCOUNTER — Encounter (HOSPITAL_COMMUNITY): Payer: BC Managed Care – PPO

## 2014-01-11 ENCOUNTER — Encounter (HOSPITAL_COMMUNITY): Payer: BC Managed Care – PPO

## 2014-01-13 ENCOUNTER — Encounter (HOSPITAL_COMMUNITY): Payer: BC Managed Care – PPO

## 2014-01-14 NOTE — Addendum Note (Signed)
Addendum created 01/14/14 1331 by Laurie Panda, MD   Modules edited: Anesthesia LDA

## 2014-01-15 ENCOUNTER — Encounter (HOSPITAL_COMMUNITY): Payer: BC Managed Care – PPO

## 2014-01-18 ENCOUNTER — Encounter (HOSPITAL_COMMUNITY): Payer: BC Managed Care – PPO

## 2014-01-20 ENCOUNTER — Other Ambulatory Visit (INDEPENDENT_AMBULATORY_CARE_PROVIDER_SITE_OTHER): Payer: BC Managed Care – PPO

## 2014-01-20 ENCOUNTER — Encounter (HOSPITAL_COMMUNITY)
Admission: RE | Admit: 2014-01-20 | Discharge: 2014-01-20 | Disposition: A | Discharge status: Home / Self Care | Payer: BC Managed Care – PPO | Source: Ambulatory Visit | Attending: Interventional Cardiology | Admitting: Interventional Cardiology

## 2014-01-20 DIAGNOSIS — E785 Hyperlipidemia, unspecified: Secondary | ICD-10-CM

## 2014-01-20 LAB — LIPID PANEL
Cholesterol: 152 mg/dL (ref 0–200)
HDL: 40.9 mg/dL
LDL Cholesterol: 93 mg/dL (ref 0–99)
NonHDL: 111.1
Total CHOL/HDL Ratio: 4
Triglycerides: 89 mg/dL (ref 0.0–149.0)
VLDL: 17.8 mg/dL (ref 0.0–40.0)

## 2014-01-20 LAB — ALT: ALT: 13 U/L (ref 0–53)

## 2014-01-22 ENCOUNTER — Encounter (HOSPITAL_COMMUNITY)
Admission: RE | Admit: 2014-01-22 | Discharge: 2014-01-22 | Disposition: A | Discharge status: Home / Self Care | Payer: BC Managed Care – PPO | Source: Ambulatory Visit | Attending: Interventional Cardiology | Admitting: Interventional Cardiology

## 2014-01-22 ENCOUNTER — Telehealth: Payer: Self-pay

## 2014-01-22 NOTE — Telephone Encounter (Signed)
pt aware of lab results.Labs are stable/slightly improved. He needs to diet and exercise. Recheck 1 year

## 2014-01-22 NOTE — Telephone Encounter (Signed)
Message copied by Lamar Laundry on Fri Jan 22, 2014  3:09 PM ------      Message from: Daneen Schick      Created: Wed Jan 20, 2014  6:28 PM       Labs are stable/slightly improved. He needs to diet and exercise. Recheck 1 year ------

## 2014-01-25 ENCOUNTER — Encounter (HOSPITAL_COMMUNITY)
Admission: RE | Admit: 2014-01-25 | Discharge: 2014-01-25 | Disposition: A | Discharge status: Home / Self Care | Payer: BC Managed Care – PPO | Source: Ambulatory Visit | Attending: Interventional Cardiology | Admitting: Interventional Cardiology

## 2014-01-25 NOTE — Progress Notes (Signed)
[name] 63 y.o. male Nutrition Note Spoke with pt.  Nutrition Survey reviewed with pt. Pt is working towards following Step the Therapeutic Lifestyle Changes diet. Pt wants to lose wt. Pt has not been currently trying to lose wt. Pt reports he has gained 2 kg due to a recent "melanoma scare, which was a tough 6 weeks." Wt loss tips brieflyreviewed. Pt expressed understanding of the information reviewed. Pt aware of nutrition education classes offered.  Nutrition Diagnosis   Food-and nutrition-related knowledge deficit related to lack of exposure to information as related to diagnosis of: ? CVD    Overweight related to excessive energy intake as evidenced by a BMI of 28.0  Nutrition Intervention   Benefits of adopting Therapeutic Lifestyle Changes discussed when Medficts reviewed.   Pt to attend the Portion Distortion class - met; 12/16/13   Pt to attend the  ? Nutrition I class - met; 12/22/13                    ? Nutrition II class   Continue client-centered nutrition education by RD, as part of interdisciplinary care.  Goal(s)   Pt to identify and limit food sources of saturated fat, trans fat, and cholesterol   Pt to identify food quantities necessary to achieve: ? wt loss to a goal wt of 160-178 lb (72.7-80.9 kg) at graduation from cardiac rehab.   Monitor and Evaluate progress toward nutrition goal with team. Nutrition Risk:  Low   Derek Mound, M.Ed, RD, LDN, CDE 01/25/2014 2:56 PM

## 2014-01-27 ENCOUNTER — Encounter (HOSPITAL_COMMUNITY)
Admission: RE | Admit: 2014-01-27 | Discharge: 2014-01-27 | Disposition: A | Discharge status: Home / Self Care | Payer: BC Managed Care – PPO | Source: Ambulatory Visit | Attending: Interventional Cardiology | Admitting: Interventional Cardiology

## 2014-01-29 ENCOUNTER — Encounter (HOSPITAL_COMMUNITY)
Admission: RE | Admit: 2014-01-29 | Discharge: 2014-01-29 | Disposition: A | Discharge status: Home / Self Care | Payer: BC Managed Care – PPO | Source: Ambulatory Visit | Attending: Interventional Cardiology | Admitting: Interventional Cardiology

## 2014-02-01 ENCOUNTER — Encounter (HOSPITAL_COMMUNITY)
Admission: RE | Admit: 2014-02-01 | Discharge: 2014-02-01 | Disposition: A | Discharge status: Home / Self Care | Payer: BC Managed Care – PPO | Source: Ambulatory Visit | Attending: Interventional Cardiology | Admitting: Interventional Cardiology

## 2014-02-01 DIAGNOSIS — Z951 Presence of aortocoronary bypass graft: Secondary | ICD-10-CM | POA: Insufficient documentation

## 2014-02-01 DIAGNOSIS — I214 Non-ST elevation (NSTEMI) myocardial infarction: Secondary | ICD-10-CM | POA: Insufficient documentation

## 2014-02-03 ENCOUNTER — Encounter (HOSPITAL_COMMUNITY)
Admission: RE | Admit: 2014-02-03 | Discharge: 2014-02-03 | Disposition: A | Discharge status: Home / Self Care | Payer: BC Managed Care – PPO | Source: Ambulatory Visit | Attending: Interventional Cardiology | Admitting: Interventional Cardiology

## 2014-02-03 DIAGNOSIS — I214 Non-ST elevation (NSTEMI) myocardial infarction: Secondary | ICD-10-CM | POA: Diagnosis not present

## 2014-02-04 ENCOUNTER — Other Ambulatory Visit: Payer: Self-pay | Admitting: Interventional Cardiology

## 2014-02-05 ENCOUNTER — Encounter (HOSPITAL_COMMUNITY): Payer: BC Managed Care – PPO

## 2014-02-08 ENCOUNTER — Encounter (HOSPITAL_COMMUNITY)
Admission: RE | Admit: 2014-02-08 | Discharge: 2014-02-08 | Disposition: A | Discharge status: Home / Self Care | Payer: BC Managed Care – PPO | Source: Ambulatory Visit | Attending: Interventional Cardiology | Admitting: Interventional Cardiology

## 2014-02-08 DIAGNOSIS — I214 Non-ST elevation (NSTEMI) myocardial infarction: Secondary | ICD-10-CM | POA: Diagnosis not present

## 2014-02-09 ENCOUNTER — Encounter: Payer: Self-pay | Admitting: Interventional Cardiology

## 2014-02-09 ENCOUNTER — Ambulatory Visit (INDEPENDENT_AMBULATORY_CARE_PROVIDER_SITE_OTHER): Payer: BC Managed Care – PPO | Admitting: Interventional Cardiology

## 2014-02-09 VITALS — BP 125/86 | HR 62 | Ht 68.0 in | Wt 181.0 lb

## 2014-02-09 DIAGNOSIS — I10 Essential (primary) hypertension: Secondary | ICD-10-CM

## 2014-02-09 DIAGNOSIS — E785 Hyperlipidemia, unspecified: Secondary | ICD-10-CM

## 2014-02-09 DIAGNOSIS — I25810 Atherosclerosis of coronary artery bypass graft(s) without angina pectoris: Secondary | ICD-10-CM

## 2014-02-09 NOTE — Patient Instructions (Signed)
Your physician has recommended you make the following change in your medication:  1) HOLD Simvastatin for 4-6 weeks starting today. Then resume. Call the office with an update of symptoms. [phone]  Take all other medications as prescribed  Your physician wants you to follow-up in: 6 months with Dr.Smith You will receive a reminder letter in the mail two months in advance. If you don't receive a letter, please call our office to schedule the follow-up appointment.

## 2014-02-09 NOTE — Progress Notes (Signed)
Patient ID: Eric Warren, male   DOB: 1951-04-12, 63 y.o.   MRN: [id number]    [address], [address], [place]  [address] Phone: [phone] Fax:  [phone]  Date:  02/09/2014   ID:  Eric Warren, DOB 1950/08/18, MRN [id number]  PCP:  Vena Austria, MD   ASSESSMENT:  1. Musculoskeletal right shoulder discomfort 2. Coronary artery disease, stable 3. Hyperlipidemia 4. Hypertension, controlled  PLAN:  1. I've asked the patient to discontinue simvastatin for 4-6 weeks and then resume. He should then call us with 3 portal whether or not the musculoskeletal discomfort in the right shoulder changed at all. 2. Continue medications as listed  3. Clinical followup in 6 months  SUBJECTIVE: Eric Warren is a 64 y.o. male doing well with no cardiopulmonary complaints. He is in cardiopulmonary rehabilitation. He is doing well. He has difficulty with right shoulder discomfort. This is been a problem for greater than a year. He wonders if statin therapy has anything to do with it. He denies dyspnea. He has had resection of a melanoma  last saw him.   Wt Readings from Last 3 Encounters:  02/09/14 181 lb (82.101 kg)  12/10/13 184 lb 4.9 oz (83.6 kg)  11/17/13 177 lb (80.287 kg)     Past Medical History  Diagnosis Date  . Myocardial infarction     non-STEMI 08/2013, CABG x 3 09/25/2013    Current Outpatient Prescriptions  Medication Sig Dispense Refill  . acetaminophen (TYLENOL) 500 MG tablet Take 500 mg by mouth every 6 (six) hours as needed.      Marland Kitchen aspirin EC 325 MG EC tablet Take 1 tablet (325 mg total) by mouth daily.  30 tablet  0  . beta carotene w/minerals (OCUVITE) tablet Take 1 tablet by mouth daily.      Marland Kitchen lisinopril (PRINIVIL,ZESTRIL) 5 MG tablet Take 1 tablet (5 mg total) by mouth daily.  30 tablet  3  . metoprolol (LOPRESSOR) 50 MG tablet TAKE 1 TABLET BY MOUTH TWICE DAILY  180 tablet  0  . nepafenac (NEVANAC) 0.1 % ophthalmic suspension Place 1  drop into both eyes daily.       Marland Kitchen oxyCODONE (OXY IR/ROXICODONE) 5 MG immediate release tablet Take 1-2 tablets (5-10 mg total) by mouth every 6 (six) hours as needed for moderate pain.  40 tablet  0  . simvastatin (ZOCOR) 40 MG tablet Take 40 mg by mouth daily.       No current facility-administered medications for this visit.    Allergies:   No Known Allergies  Social History:  The patient  reports that he has quit smoking. He does not have any smokeless tobacco history on file. He reports that he does not drink alcohol or use illicit drugs.   ROS:  Please see the history of present illness.   Denies palpitations, orthopnea, PND, syncope, and edema   All other systems reviewed and negative.   OBJECTIVE: VS:  BP 125/86  Pulse 62  Ht 5\' 8"  (1.727 m)  Wt 181 lb (82.101 kg)  BMI 27.53 kg/m2 Well nourished, well developed, in no acute distress, younger than stated age 40: normal Neck: JVD flat. Carotid bruit absent  Cardiac:  normal S1, S2; RRR; no murmur Lungs:  clear to auscultation bilaterally, no wheezing, rhonchi or rales Abd: soft, nontender, no hepatomegaly Ext: Edema absent. Pulses 2+ and symmetric Skin: warm and dry Neuro:  CNs 2-12 intact, no focal abnormalities noted  EKG:  Not  performed       Signed, Illene Labrador III, MD 02/09/2014 9:42 AM

## 2014-02-10 ENCOUNTER — Encounter (HOSPITAL_COMMUNITY)
Admission: RE | Admit: 2014-02-10 | Discharge: 2014-02-10 | Disposition: A | Discharge status: Home / Self Care | Payer: BC Managed Care – PPO | Source: Ambulatory Visit | Attending: Interventional Cardiology | Admitting: Interventional Cardiology

## 2014-02-10 DIAGNOSIS — I214 Non-ST elevation (NSTEMI) myocardial infarction: Secondary | ICD-10-CM | POA: Diagnosis not present

## 2014-02-12 ENCOUNTER — Encounter (HOSPITAL_COMMUNITY)
Admission: RE | Admit: 2014-02-12 | Discharge: 2014-02-12 | Disposition: A | Discharge status: Home / Self Care | Payer: BC Managed Care – PPO | Source: Ambulatory Visit | Attending: Interventional Cardiology | Admitting: Interventional Cardiology

## 2014-02-12 DIAGNOSIS — I214 Non-ST elevation (NSTEMI) myocardial infarction: Secondary | ICD-10-CM | POA: Diagnosis not present

## 2014-02-15 ENCOUNTER — Encounter (HOSPITAL_COMMUNITY)
Admission: RE | Admit: 2014-02-15 | Discharge: 2014-02-15 | Disposition: A | Discharge status: Home / Self Care | Payer: BC Managed Care – PPO | Source: Ambulatory Visit | Attending: Interventional Cardiology | Admitting: Interventional Cardiology

## 2014-02-15 DIAGNOSIS — I214 Non-ST elevation (NSTEMI) myocardial infarction: Secondary | ICD-10-CM | POA: Diagnosis not present

## 2014-02-17 ENCOUNTER — Encounter (HOSPITAL_COMMUNITY)
Admission: RE | Admit: 2014-02-17 | Discharge: 2014-02-17 | Disposition: A | Discharge status: Home / Self Care | Payer: BC Managed Care – PPO | Source: Ambulatory Visit | Attending: Interventional Cardiology | Admitting: Interventional Cardiology

## 2014-02-17 DIAGNOSIS — I214 Non-ST elevation (NSTEMI) myocardial infarction: Secondary | ICD-10-CM | POA: Diagnosis not present

## 2014-02-19 ENCOUNTER — Encounter (HOSPITAL_COMMUNITY)
Admission: RE | Admit: 2014-02-19 | Discharge: 2014-02-19 | Disposition: A | Discharge status: Home / Self Care | Payer: BC Managed Care – PPO | Source: Ambulatory Visit | Attending: Interventional Cardiology | Admitting: Interventional Cardiology

## 2014-02-19 DIAGNOSIS — I214 Non-ST elevation (NSTEMI) myocardial infarction: Secondary | ICD-10-CM | POA: Diagnosis not present

## 2014-02-22 ENCOUNTER — Encounter (HOSPITAL_COMMUNITY)
Admission: RE | Admit: 2014-02-22 | Discharge: 2014-02-22 | Disposition: A | Discharge status: Home / Self Care | Payer: BC Managed Care – PPO | Source: Ambulatory Visit | Attending: Interventional Cardiology | Admitting: Interventional Cardiology

## 2014-02-22 DIAGNOSIS — I214 Non-ST elevation (NSTEMI) myocardial infarction: Secondary | ICD-10-CM | POA: Diagnosis not present

## 2014-02-24 ENCOUNTER — Encounter (HOSPITAL_COMMUNITY)
Admission: RE | Admit: 2014-02-24 | Discharge: 2014-02-24 | Disposition: A | Discharge status: Home / Self Care | Payer: BC Managed Care – PPO | Source: Ambulatory Visit | Attending: Interventional Cardiology | Admitting: Interventional Cardiology

## 2014-02-24 DIAGNOSIS — I214 Non-ST elevation (NSTEMI) myocardial infarction: Secondary | ICD-10-CM | POA: Diagnosis not present

## 2014-02-26 ENCOUNTER — Encounter (HOSPITAL_COMMUNITY)
Admission: RE | Admit: 2014-02-26 | Discharge: 2014-02-26 | Disposition: A | Discharge status: Home / Self Care | Payer: BC Managed Care – PPO | Source: Ambulatory Visit | Attending: Interventional Cardiology | Admitting: Interventional Cardiology

## 2014-02-26 DIAGNOSIS — I214 Non-ST elevation (NSTEMI) myocardial infarction: Secondary | ICD-10-CM | POA: Diagnosis not present

## 2014-03-01 ENCOUNTER — Encounter (HOSPITAL_COMMUNITY)
Admission: RE | Admit: 2014-03-01 | Discharge: 2014-03-01 | Disposition: A | Discharge status: Home / Self Care | Payer: BC Managed Care – PPO | Source: Ambulatory Visit | Attending: Interventional Cardiology | Admitting: Interventional Cardiology

## 2014-03-01 DIAGNOSIS — I214 Non-ST elevation (NSTEMI) myocardial infarction: Secondary | ICD-10-CM | POA: Diagnosis not present

## 2014-03-03 ENCOUNTER — Encounter (HOSPITAL_COMMUNITY)
Admission: RE | Admit: 2014-03-03 | Discharge: 2014-03-03 | Disposition: A | Discharge status: Home / Self Care | Payer: BC Managed Care – PPO | Source: Ambulatory Visit | Attending: Interventional Cardiology | Admitting: Interventional Cardiology

## 2014-03-03 DIAGNOSIS — I214 Non-ST elevation (NSTEMI) myocardial infarction: Secondary | ICD-10-CM | POA: Diagnosis not present

## 2014-03-03 DIAGNOSIS — Z951 Presence of aortocoronary bypass graft: Secondary | ICD-10-CM | POA: Diagnosis present

## 2014-03-05 ENCOUNTER — Encounter (HOSPITAL_COMMUNITY): Payer: BC Managed Care – PPO

## 2014-03-10 ENCOUNTER — Encounter (HOSPITAL_COMMUNITY)
Admission: RE | Admit: 2014-03-10 | Discharge: 2014-03-10 | Disposition: A | Discharge status: Home / Self Care | Payer: BC Managed Care – PPO | Source: Ambulatory Visit | Attending: Interventional Cardiology | Admitting: Interventional Cardiology

## 2014-03-10 DIAGNOSIS — I214 Non-ST elevation (NSTEMI) myocardial infarction: Secondary | ICD-10-CM | POA: Diagnosis not present

## 2014-03-12 ENCOUNTER — Encounter (HOSPITAL_COMMUNITY)
Admission: RE | Admit: 2014-03-12 | Discharge: 2014-03-12 | Disposition: A | Discharge status: Home / Self Care | Payer: BC Managed Care – PPO | Source: Ambulatory Visit | Attending: Interventional Cardiology | Admitting: Interventional Cardiology

## 2014-03-12 DIAGNOSIS — I214 Non-ST elevation (NSTEMI) myocardial infarction: Secondary | ICD-10-CM | POA: Diagnosis not present

## 2014-03-15 ENCOUNTER — Encounter (HOSPITAL_COMMUNITY)
Admission: RE | Admit: 2014-03-15 | Discharge: 2014-03-15 | Disposition: A | Discharge status: Home / Self Care | Payer: BC Managed Care – PPO | Source: Ambulatory Visit | Attending: Interventional Cardiology | Admitting: Interventional Cardiology

## 2014-03-15 DIAGNOSIS — I214 Non-ST elevation (NSTEMI) myocardial infarction: Secondary | ICD-10-CM | POA: Diagnosis not present

## 2014-03-17 ENCOUNTER — Encounter (HOSPITAL_COMMUNITY)
Admission: RE | Admit: 2014-03-17 | Discharge: 2014-03-17 | Disposition: A | Discharge status: Home / Self Care | Payer: BC Managed Care – PPO | Source: Ambulatory Visit | Attending: Interventional Cardiology | Admitting: Interventional Cardiology

## 2014-03-17 DIAGNOSIS — I214 Non-ST elevation (NSTEMI) myocardial infarction: Secondary | ICD-10-CM | POA: Diagnosis not present

## 2014-03-19 ENCOUNTER — Encounter (HOSPITAL_COMMUNITY)
Admission: RE | Admit: 2014-03-19 | Discharge: 2014-03-19 | Disposition: A | Discharge status: Home / Self Care | Payer: BC Managed Care – PPO | Source: Ambulatory Visit | Attending: Interventional Cardiology | Admitting: Interventional Cardiology

## 2014-03-19 DIAGNOSIS — I214 Non-ST elevation (NSTEMI) myocardial infarction: Secondary | ICD-10-CM | POA: Diagnosis not present

## 2014-03-22 ENCOUNTER — Telehealth: Payer: Self-pay | Admitting: Interventional Cardiology

## 2014-03-22 ENCOUNTER — Encounter (HOSPITAL_COMMUNITY)
Admission: RE | Admit: 2014-03-22 | Discharge: 2014-03-22 | Disposition: A | Discharge status: Home / Self Care | Payer: BC Managed Care – PPO | Source: Ambulatory Visit | Attending: Interventional Cardiology | Admitting: Interventional Cardiology

## 2014-03-22 DIAGNOSIS — I214 Non-ST elevation (NSTEMI) myocardial infarction: Secondary | ICD-10-CM | POA: Diagnosis not present

## 2014-03-22 NOTE — Telephone Encounter (Signed)
PER  PT   CALLED  RE  HOLDING OF  SIMVASTATIN   VERY  LITTLE   CHANGE  WITH PAIN    WILL  NOTIFY  DR  Tamala Julian   THAT   PT   IS  GOING  TO  RESTART  .Adonis Housekeeper

## 2014-03-22 NOTE — Telephone Encounter (Signed)
Patient would like to discuss medication. Please call and advise.

## 2014-03-24 ENCOUNTER — Encounter (HOSPITAL_COMMUNITY)
Admission: RE | Admit: 2014-03-24 | Discharge: 2014-03-24 | Disposition: A | Discharge status: Home / Self Care | Payer: BC Managed Care – PPO | Source: Ambulatory Visit | Attending: Interventional Cardiology | Admitting: Interventional Cardiology

## 2014-03-24 DIAGNOSIS — I214 Non-ST elevation (NSTEMI) myocardial infarction: Secondary | ICD-10-CM | POA: Diagnosis not present

## 2014-03-25 NOTE — Telephone Encounter (Signed)
This is correct. If he worsens let me know.

## 2014-03-26 ENCOUNTER — Encounter (HOSPITAL_COMMUNITY)
Admission: RE | Admit: 2014-03-26 | Discharge: 2014-03-26 | Disposition: A | Discharge status: Home / Self Care | Payer: BC Managed Care – PPO | Source: Ambulatory Visit | Attending: Interventional Cardiology | Admitting: Interventional Cardiology

## 2014-03-26 DIAGNOSIS — I214 Non-ST elevation (NSTEMI) myocardial infarction: Secondary | ICD-10-CM | POA: Diagnosis not present

## 2014-03-29 ENCOUNTER — Encounter (HOSPITAL_COMMUNITY)
Admission: RE | Admit: 2014-03-29 | Discharge: 2014-03-29 | Disposition: A | Discharge status: Home / Self Care | Payer: BC Managed Care – PPO | Source: Ambulatory Visit | Attending: Interventional Cardiology | Admitting: Interventional Cardiology

## 2014-03-29 DIAGNOSIS — I214 Non-ST elevation (NSTEMI) myocardial infarction: Secondary | ICD-10-CM | POA: Diagnosis not present

## 2014-03-31 ENCOUNTER — Other Ambulatory Visit: Payer: Self-pay | Admitting: Interventional Cardiology

## 2014-03-31 ENCOUNTER — Encounter (HOSPITAL_COMMUNITY)
Admission: RE | Admit: 2014-03-31 | Discharge: 2014-03-31 | Disposition: A | Discharge status: Home / Self Care | Payer: BC Managed Care – PPO | Source: Ambulatory Visit | Attending: Interventional Cardiology | Admitting: Interventional Cardiology

## 2014-03-31 ENCOUNTER — Encounter (HOSPITAL_COMMUNITY): Payer: Self-pay

## 2014-03-31 DIAGNOSIS — I214 Non-ST elevation (NSTEMI) myocardial infarction: Secondary | ICD-10-CM | POA: Diagnosis not present

## 2014-03-31 NOTE — Progress Notes (Signed)
Pt graduated from cardiac rehab program today with completion of 36 exercise sessions in Phase II. Pt maintained good attendance and progressed nicely during his participation in rehab as evidenced by increased MET level.   Medication list reconciled. Repeat  PHQ9 score-  0.  Pt has made significant lifestyle changes and should be commended for his success.   Pt feels he has met rehab goal of increased stamina, however admits that he has not met his weight loss goal due to his own compliance with portion control.  Pt encouraged to continue weight loss regimen and  congratulated on his efforts in cardiac rehab.  Pt plans to exercise on his own.

## 2014-04-02 ENCOUNTER — Other Ambulatory Visit: Payer: Self-pay | Admitting: Interventional Cardiology

## 2014-04-02 ENCOUNTER — Encounter (HOSPITAL_COMMUNITY): Payer: BC Managed Care – PPO

## 2014-04-05 ENCOUNTER — Encounter (HOSPITAL_COMMUNITY): Payer: BC Managed Care – PPO

## 2014-04-07 ENCOUNTER — Encounter (HOSPITAL_COMMUNITY): Payer: BC Managed Care – PPO | Attending: Interventional Cardiology

## 2014-04-07 DIAGNOSIS — I214 Non-ST elevation (NSTEMI) myocardial infarction: Secondary | ICD-10-CM | POA: Insufficient documentation

## 2014-04-07 DIAGNOSIS — Z951 Presence of aortocoronary bypass graft: Secondary | ICD-10-CM | POA: Insufficient documentation

## 2014-04-09 ENCOUNTER — Encounter (HOSPITAL_COMMUNITY): Payer: BC Managed Care – PPO

## 2014-04-12 ENCOUNTER — Encounter (HOSPITAL_COMMUNITY): Payer: BC Managed Care – PPO

## 2014-04-14 ENCOUNTER — Encounter (HOSPITAL_COMMUNITY): Payer: BC Managed Care – PPO

## 2014-04-16 ENCOUNTER — Encounter (HOSPITAL_COMMUNITY): Payer: BC Managed Care – PPO

## 2014-04-21 ENCOUNTER — Ambulatory Visit (INDEPENDENT_AMBULATORY_CARE_PROVIDER_SITE_OTHER): Payer: BC Managed Care – PPO | Admitting: Family Medicine

## 2014-04-21 ENCOUNTER — Other Ambulatory Visit (INDEPENDENT_AMBULATORY_CARE_PROVIDER_SITE_OTHER): Payer: BC Managed Care – PPO

## 2014-04-21 ENCOUNTER — Encounter: Payer: Self-pay | Admitting: Family Medicine

## 2014-04-21 VITALS — BP 130/84 | HR 53 | Ht 68.0 in | Wt 186.0 lb

## 2014-04-21 DIAGNOSIS — M129 Arthropathy, unspecified: Secondary | ICD-10-CM

## 2014-04-21 DIAGNOSIS — M19019 Primary osteoarthritis, unspecified shoulder: Secondary | ICD-10-CM

## 2014-04-21 DIAGNOSIS — M25511 Pain in right shoulder: Secondary | ICD-10-CM

## 2014-04-21 DIAGNOSIS — Z85820 Personal history of malignant melanoma of skin: Secondary | ICD-10-CM

## 2014-04-21 DIAGNOSIS — M7551 Bursitis of right shoulder: Secondary | ICD-10-CM

## 2014-04-21 DIAGNOSIS — M7550 Bursitis of unspecified shoulder: Secondary | ICD-10-CM | POA: Insufficient documentation

## 2014-04-21 NOTE — Patient Instructions (Signed)
Good to meet you Subacromial bursitis.  Ice 20 minutes 2 times daily. Usually after activity and before bed. Exercises 3 times a week.  Vitamin D 2000 IU daily.  Come back in 3 weeks.  If continue pain we will get xrays of shoulder and neck to see whats going on.

## 2014-04-21 NOTE — Progress Notes (Signed)
[facility] [address], [place] [address] Phone: [phone] Subjective:     CC: Right shoulder pain  [id number] Eric Warren is a 63 y.o. male coming in with complaint of right shoulder pain. Patient states that he has had this pain for multiple months if not a 4 year. Patient does not remember any true injury but noticed after trying to start a lawn mower repetitively he had a dull aching sensation. Patient states now it hurts more when he tries to reach overhead or reach behind his back. States that it can be a severe sharp pain with the severity is 7/10. Patient states he usually does have a dull aching sensation of 2/10 at all times. States that sometimes it might wake him up at night. Denies any radiation down his arms or any numbness. Patient's recent past medical history is positive for coronary artery bypass surgery as well as melanoma resection. The contralateral shoulder starting to give him some mild discomfort but is fairly similar.     Past medical history, social, surgical and family history all reviewed in electronic medical record.   Review of Systems: No headache, visual changes, nausea, vomiting, diarrhea, constipation, dizziness, abdominal pain, skin rash, fevers, chills, night sweats, weight loss, swollen lymph nodes, body aches, joint swelling, muscle aches, chest pain, shortness of breath, mood changes.   Objective Blood pressure 130/84, pulse 53, height 5\' 8"  (1.727 m), weight 186 lb (84.369 kg), SpO2 98.00%.  General: No apparent distress alert and oriented x3 mood and affect normal, dressed appropriately.  HEENT: Pupils equal, extraocular movements intact  Respiratory: Patient's speak in full sentences and does not appear short of breath  Cardiovascular: No lower extremity edema, non tender, no erythema  Skin: Warm dry intact with no signs of infection or rash on extremities or on axial skeleton.  Abdomen: Soft nontender   Neuro: Cranial nerves II through XII are intact, neurovascularly intact in all extremities with 2+ DTRs and 2+ pulses.  Lymph: No lymphadenopathy of posterior or anterior cervical chain or axillae bilaterally.  Gait normal with good balance and coordination.  MSK:  Non tender with full range of motion and good stability and symmetric strength and tone of  elbows, wrist, hip, knee and ankles bilaterally.  Shoulder: Right Inspection revealed the patient does have atrophy of the shoulder girdle on the right compared to the contralateral side.. Palpation is normal with no tenderness over AC joint or bicipital groove. ROM is is near full passively except for abduction to only and 90. Rotator cuff strength normal throughout. signs of impingement with positive Neer and Hawkin's tests, but negative empty can sign. Speeds and Yergason's tests normal. No labral pathology noted with negative Obrien's, negative clunk and good stability. Normal scapular function observed. No painful arc and no drop arm sign. No apprehension sign Contralateral shoulder unremarkable  MSK US performed of: Right This study was ordered, performed, and interpreted by Charlann Boxer D.O.  Shoulder:   Supraspinatus:  Appears normal on long and transverse views, Bursal bulge seen with shoulder abduction on impingement view. Infraspinatus:  Appears normal on long and transverse views. Significant increase in Doppler flow Subscapularis:  Appears normal on long and transverse views. Positive bursa Teres Minor:  Appears normal on long and transverse views. AC joint:  Severe arthritis. Glenohumeral Joint:  Appears normal without effusion. Glenoid Labrum:  Intact without visualized tears. Biceps Tendon:  Appears normal on long and transverse views, no fraying of tendon,  tendon located in intertubercular groove, no subluxation with shoulder internal or external rotation.  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  Verbal informed consent obtained.  Time-out conducted.  Noted no overlying erythema, induration, or other signs of local infection.  Skin prepped in a sterile fashion.  Local anesthesia: Topical Ethyl chloride.  With sterile technique and under real time ultrasound guidance:  Joint visualized.  23g 1  inch needle inserted posterior approach. Pictures taken for needle placement. Patient did have injection of 2 cc of 1% lidocaine, 2 cc of 0.5% Marcaine, and 1.0 cc of Kenalog 40 mg/dL. Completed without difficulty  Pain immediately resolved suggesting accurate placement of the medication.  Advised to call if fevers/chills, erythema, induration, drainage, or persistent bleeding.  Images permanently stored and available for review in the ultrasound unit.  Impression: Technically successful ultrasound guided injection.     Impression and Recommendations:     This case required medical decision making of moderate complexity.

## 2014-04-21 NOTE — Assessment & Plan Note (Signed)
Do to patient's other comorbidities we decided that an intra-articular injection would be best. Patient tolerated the procedure very well. We discussed an icing regimen and given home exercise program. Patient also has severe osteophytic changes of the a.c. joint it is also decreasing his range of motion. Discuss with him that we may need to consider an intra-articular injection in the a.c. joint this patient has not make any considerable improvement. Patient was pain free after this injection which makes the optimistic he will do well. We discussed over-the-counter medications are to be beneficial as well. Differential does include cervical radiculopathy with patient having some mild atrophy of the shoulder girdle musculature compared to the contralateral side. When patient comes back if he has not made any significant improvements I would like to get x-rays of the neck and shoulder. We will discuss this at followup.

## 2014-04-26 ENCOUNTER — Encounter: Payer: Self-pay | Admitting: Interventional Cardiology

## 2014-05-05 ENCOUNTER — Ambulatory Visit (INDEPENDENT_AMBULATORY_CARE_PROVIDER_SITE_OTHER): Payer: BC Managed Care – PPO | Admitting: Family Medicine

## 2014-05-05 ENCOUNTER — Encounter: Payer: Self-pay | Admitting: Family Medicine

## 2014-05-05 VITALS — BP 128/80 | HR 56 | Ht 68.0 in | Wt 185.0 lb

## 2014-05-05 DIAGNOSIS — M129 Arthropathy, unspecified: Secondary | ICD-10-CM

## 2014-05-05 DIAGNOSIS — M7551 Bursitis of right shoulder: Secondary | ICD-10-CM

## 2014-05-05 DIAGNOSIS — M19019 Primary osteoarthritis, unspecified shoulder: Secondary | ICD-10-CM

## 2014-05-05 DIAGNOSIS — Z85820 Personal history of malignant melanoma of skin: Secondary | ICD-10-CM

## 2014-05-05 NOTE — Progress Notes (Signed)
  [facility] [address] [place], [place] [address] Phone: [phone] Subjective:     CC: Right shoulder painf ollow-up  [id number] Eric Warren is a 63 y.o. male coming in with complaint of right shoulder pain. 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 Patient did respond very well to an intra-articular injection and states that he is approximately 90% better. Still has some difficulty with above the head range of motion. Patient states that he can have a sharp pain but it is significantly less than it was previously. Patient has been able to get back in the gym and is feeling much better. Denies any radiation down his arm. Denies any fevers chills or any abnormal weight loss. Patient is happy with the results so far.     Past medical history, social, surgical and family history all reviewed in electronic medical record.   Review of Systems: No headache, visual changes, nausea, vomiting, diarrhea, constipation, dizziness, abdominal pain, skin rash, fevers, chills, night sweats, weight loss, swollen lymph nodes, body aches, joint swelling, muscle aches, chest pain, shortness of breath, mood changes.   Objective Blood pressure 128/80, pulse 56, height 5\' 8"  (1.727 m), weight 185 lb (83.915 kg), SpO2 97 %.  General: No apparent distress alert and oriented x3 mood and affect normal, dressed appropriately.  HEENT: Pupils equal, extraocular movements intact  Respiratory: Patient's speak in full sentences and does not appear short of breath  Cardiovascular: No lower extremity edema, non tender, no erythema  Skin: Warm dry intact with no signs of infection or rash on extremities or on axial skeleton.  Abdomen: Soft nontender  Neuro: Cranial nerves II through XII are intact, neurovascularly intact in all extremities with 2+ DTRs and 2+ pulses.  Lymph: No  lymphadenopathy of posterior or anterior cervical chain or axillae bilaterally.  Gait normal with good balance and coordination.  MSK:  Non tender with full range of motion and good stability and symmetric strength and tone of  elbows, wrist, hip, knee and ankles bilaterally.  Shoulder: Right Inspection revealed the patient does have atrophy of the shoulder girdle on the right compared to the contralateral side.no change from previous exam Minimal tenderness over the before meals joint ROM is is near full passively except for abduction to only and 95. Rotator cuff strength normal throughout. signs of impingement with positive Neer and Hawkin's tests, but negative empty can sign.significantly less severe than previous exam Speeds and Yergason's tests normal. No labral pathology noted with negative Obrien's, negative clunk and good stability. Normal scapular function observed. No painful arc and no drop arm sign. No apprehension sign Contralateral shoulder unremarkable      Impression and Recommendations:     This case required medical decision making of moderate complexity.

## 2014-05-05 NOTE — Assessment & Plan Note (Signed)
I believe the patient's limited range of motion is likely secondary to the before meals joint. I do think that a possible injection may be helpful with some the pain but I do not think he is going to have full range of motion. We discussed this in great detail today. Patient is understanding the only way to get this made motion back was likely be surgical intervention which is something patient would like to avoid. We will continue to monitor closely. We discussed patient he the following up on an as needed coming back in 6 weeks. Pain worsens before three-month interval of corticosteroid injection of the intra-articular joint I would consider an before meals joint injection for diagnostic as well as therapeutic relief.

## 2014-05-05 NOTE — Patient Instructions (Signed)
Good to see you You are doing great. Ice is still your friend at the end of the day.  Continue the exercises 3 times a week See me again in 6 weeks if pain is not completely gone.

## 2014-05-05 NOTE — Assessment & Plan Note (Signed)
Discussed with patient the idea of getting x-rays of the shoulder and the neck due to his history of melanoma the patient declined.

## 2014-05-05 NOTE — Assessment & Plan Note (Signed)
Discussed with patient at great length. We discussed the possibility of actually need to do another injection in the next 3-4 months. We discussed continuing the exercises and the icing protocol and a regular basis. We discussed continuing the supplementations over-the-counter. Patient and will follow-up with me again on an as-needed basis.

## 2014-06-10 ENCOUNTER — Encounter (HOSPITAL_COMMUNITY): Payer: Self-pay | Admitting: Cardiovascular Disease

## 2014-07-16 ENCOUNTER — Other Ambulatory Visit: Payer: Self-pay | Admitting: Interventional Cardiology

## 2014-08-23 ENCOUNTER — Ambulatory Visit (INDEPENDENT_AMBULATORY_CARE_PROVIDER_SITE_OTHER): Payer: BC Managed Care – PPO | Admitting: Interventional Cardiology

## 2014-08-23 ENCOUNTER — Encounter: Payer: Self-pay | Admitting: Interventional Cardiology

## 2014-08-23 VITALS — BP 120/78 | HR 63 | Ht 68.5 in | Wt 198.2 lb

## 2014-08-23 DIAGNOSIS — I10 Essential (primary) hypertension: Secondary | ICD-10-CM

## 2014-08-23 DIAGNOSIS — E785 Hyperlipidemia, unspecified: Secondary | ICD-10-CM

## 2014-08-23 DIAGNOSIS — I25119 Atherosclerotic heart disease of native coronary artery with unspecified angina pectoris: Secondary | ICD-10-CM

## 2014-08-23 MED ORDER — METOPROLOL SUCCINATE ER 100 MG PO TB24: 100.0000 mg | ORAL_TABLET | Freq: Every day | ORAL | Status: DC

## 2014-08-23 NOTE — Progress Notes (Signed)
Cardiology Office Note   Date:  08/23/2014   ID:  Eric Warren, DOB June 28, 1951, MRN [id number]  PCP:  Vena Austria, MD  Cardiologist:   Sinclair Grooms, MD   No chief complaint on file.     History of Present Illness: Eric Warren is a 64 y.o. male who presents for f/u CAD and recent CABG. He has elevated cholesterol done in October was sub-optimal with LDL 112. No angina and denies dyspnea. He has no claudication or other CV complaints.    Past Medical History  Diagnosis Date  . Myocardial infarction     non-STEMI 08/2013, CABG x 3 09/25/2013    Past Surgical History  Procedure Laterality Date  . Coronary artery bypass graft N/A 09/25/2013    Procedure: CORONARY ARTERY BYPASS GRAFTING (CABG);  Surgeon: Melrose Nakayama, MD;  Location: [facility];  Service: Open Heart Surgery;  Laterality: N/A;  Times 3 using left internal mammary artery and endoscopically harvested right saphenous vein.  . Intraoperative transesophageal echocardiogram N/A 09/25/2013    Procedure: INTRAOPERATIVE TRANSESOPHAGEAL ECHOCARDIOGRAM;  Surgeon: Melrose Nakayama, MD;  Location: [facility];  Service: Open Heart Surgery;  Laterality: N/A;  . Left heart catheterization with coronary angiogram N/A 09/25/2013    Procedure: LEFT HEART CATHETERIZATION WITH CORONARY ANGIOGRAM;  Surgeon: Birdie Riddle, MD;  Location: [facility];  Service: Cardiovascular;  Laterality: N/A;     Current Outpatient Prescriptions  Medication Sig Dispense Refill  . aspirin EC 325 MG EC tablet Take 1 tablet (325 mg total) by mouth daily. 30 tablet 0  . beta carotene w/minerals (OCUVITE) tablet Take 1 tablet by mouth daily.    . Cholecalciferol (VITAMIN D3) 2000 UNITS TABS Take 1 tablet by mouth daily.    Marland Kitchen lisinopril (PRINIVIL,ZESTRIL) 5 MG tablet TAKE 1 TABLET BY MOUTH DAILY 30 tablet 6  . metoprolol (LOPRESSOR) 50 MG tablet TAKE 1 TABLET BY MOUTH TWICE DAILY 60 tablet 1  . simvastatin (ZOCOR) 40 MG tablet Take 40 mg  by mouth daily.     No current facility-administered medications for this visit.    Allergies:   Review of patient's allergies indicates no known allergies.    Social History:  The patient  reports that he has quit smoking. He does not have any smokeless tobacco history on file. He reports that he does not drink alcohol or use illicit drugs.   Family History:  The patient's family history includes Coronary artery disease (age of onset: 52) in his father; Heart attack in his father; Heart disease in his father.    ROS:  Please see the history of present illness.   Otherwise, review of systems are positive for decreased memory. He .   All other systems are reviewed and negative.    PHYSICAL EXAM: VS:  BP 120/78 mmHg  Pulse 63  Ht 5' 8.5" (1.74 m)  Wt 198 lb 3.2 oz (89.903 kg)  BMI 29.69 kg/m2  SpO2 97% , BMI Body mass index is 29.69 kg/(m^2). GEN: Well nourished, well developed, in no acute distress HEENT: normal Neck: no JVD, carotid bruits, or masses Cardiac: RRR; no murmurs, rubs, or gallops,no edema  Respiratory:  clear to auscultation bilaterally, normal work of breathing GI: soft, nontender, nondistended, + BS MS: no deformity or atrophy Skin: warm and dry, no rash Neuro:  Strength and sensation are intact Psych: euthymic mood, full affect   EKG:  EKG is not ordered today.   Recent Labs: 09/26/2013: Magnesium 2.4  09/28/2013: BUN 16; Creatinine 0.80; Hemoglobin 12.1*; Platelets 169; Potassium 4.4; Sodium 139 01/20/2014: ALT 13    Lipid Panel    Component Value Date/Time   CHOL 152 01/20/2014 0824   TRIG 89.0 01/20/2014 0824   HDL 40.90 01/20/2014 0824   CHOLHDL 4 01/20/2014 0824   VLDL 17.8 01/20/2014 0824   LDLCALC 93 01/20/2014 0824      Wt Readings from Last 3 Encounters:  08/23/14 198 lb 3.2 oz (89.903 kg)  05/05/14 185 lb (83.915 kg)  04/21/14 186 lb (84.369 kg)      Other studies Reviewed: Additional studies/ records that were reviewed today  include: None . Review of the above records demonstrates:    ASSESSMENT AND PLAN:  1. CAD with recent CABG without angina 2. Hyperlipidemia, with LDL greater than 110 when done in October by Dr. Alyson Ingles. We reviewed the patient's activity, weight, and diet, and there is a lot of room for improvement. We will therefore not increase his medication regimen but simply get back on an exercise routine, try to lose a little weight, and get back on a heart healthy diet. 3. Hypertension, with adequate control   Current medicines are reviewed at length with the patient today.  The patient has concerns regarding medicines.  The following changes have been made:  We will move his statin therapy to every morning. He is missing the evening doses frequently. We will change metoprolol from the tartrate to the succinate which will then be taken each morning and he will have to worry about an evening dose.  Labs/ tests ordered today include: Lipid and ALT in 12 weeks.  No orders of the defined types were placed in this encounter.     Disposition:   FU with Linard Millers in 1 Year   Signed, Sinclair Grooms, MD  08/23/2014 3:35 PM    [facility] [address], [place], [place]  [address] Phone: [phone]; Fax: [phone]

## 2014-08-23 NOTE — Patient Instructions (Signed)
Your physician has recommended you make the following change in your medication:  1) COMPLETE your supply of Metoprolol Tartrate 2) START Metoprolol Succinate 100mg  daily. An Rx has been sent to your pharmacy  Your physician recommends that you return for a FASTING lipid profile and alt in 10-12 weeks  Your physician wants you to follow-up in: 1 year with Dr.Smith You will receive a reminder letter in the mail two months in advance. If you don't receive a letter, please call our office to schedule the follow-up appointment.

## 2014-10-27 ENCOUNTER — Other Ambulatory Visit: Payer: Self-pay | Admitting: Interventional Cardiology

## 2014-11-01 ENCOUNTER — Other Ambulatory Visit (INDEPENDENT_AMBULATORY_CARE_PROVIDER_SITE_OTHER): Payer: BC Managed Care – PPO | Admitting: *Deleted

## 2014-11-01 DIAGNOSIS — E785 Hyperlipidemia, unspecified: Secondary | ICD-10-CM

## 2014-11-01 LAB — LIPID PANEL
CHOLESTEROL: 168 mg/dL (ref 0–200)
HDL: 31.8 mg/dL — AB (ref >39.00)
LDL CALC: 116 mg/dL — AB (ref 0–99)
NonHDL: 136.2
Total CHOL/HDL Ratio: 5
Triglycerides: 102 mg/dL (ref 0.0–149.0)
VLDL: 20.4 mg/dL (ref 0.0–40.0)

## 2014-11-01 LAB — ALT: ALT: 16 U/L (ref 0–53)

## 2014-11-05 ENCOUNTER — Other Ambulatory Visit: Payer: Self-pay

## 2014-11-05 DIAGNOSIS — E785 Hyperlipidemia, unspecified: Secondary | ICD-10-CM

## 2014-12-25 ENCOUNTER — Other Ambulatory Visit: Payer: Self-pay | Admitting: Interventional Cardiology

## 2015-02-04 IMAGING — DX DG CHEST 1V PORT
1 series · 1 of 1 positions shown · non-contrast
Comparison: DG CHEST 1V PORT dated 09/26/2013; DG CHEST 1V PORT
dated 09/25/2013

CLINICAL DATA: Postop cardiac surgery.

EXAM:
PORTABLE CHEST - 1 VIEW

[portable]
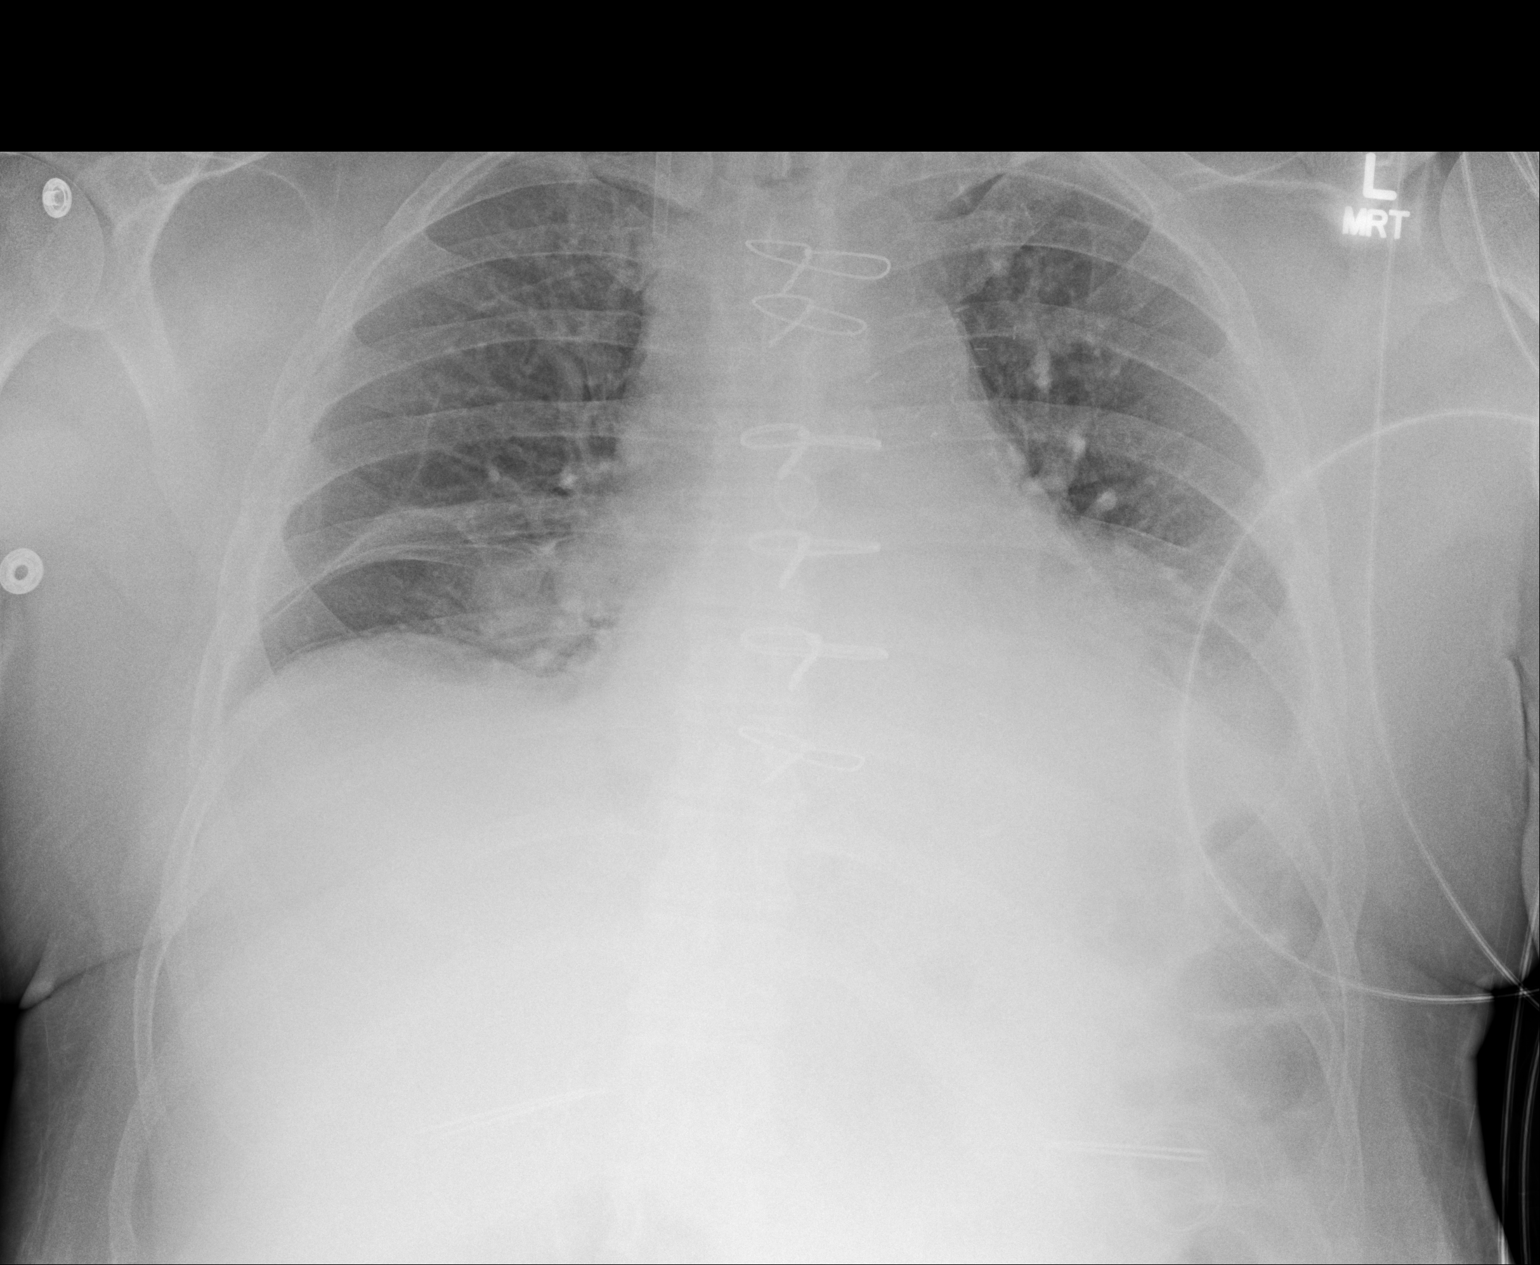

[1 of 1 positions shown; findings below may reference images not displayed]

FINDINGS: Interval removal of chest drains and Swan-Ganz catheter. The right
IJ sheath is in stable position. There are low lung volumes with
bibasilar atelectasis and a small left pleural effusion. No
pneumothorax. Stable cardiopericardial enlargement. Posterior right
first rib fracture.
IMPRESSION: 1. No air leak after thoracic drain removal.
2. Bibasilar atelectasis and small left pleural effusion, mildly
increased from yesterday.
3. Right first rib fracture.

## 2015-03-27 IMAGING — CR DG CHEST 2V
2 series · 2 of 2 positions shown · non-contrast
Comparison: October 27, 2013

CLINICAL DATA: Cough and chest discomfort

EXAM:
CHEST  2 VIEW

[w chest pa]
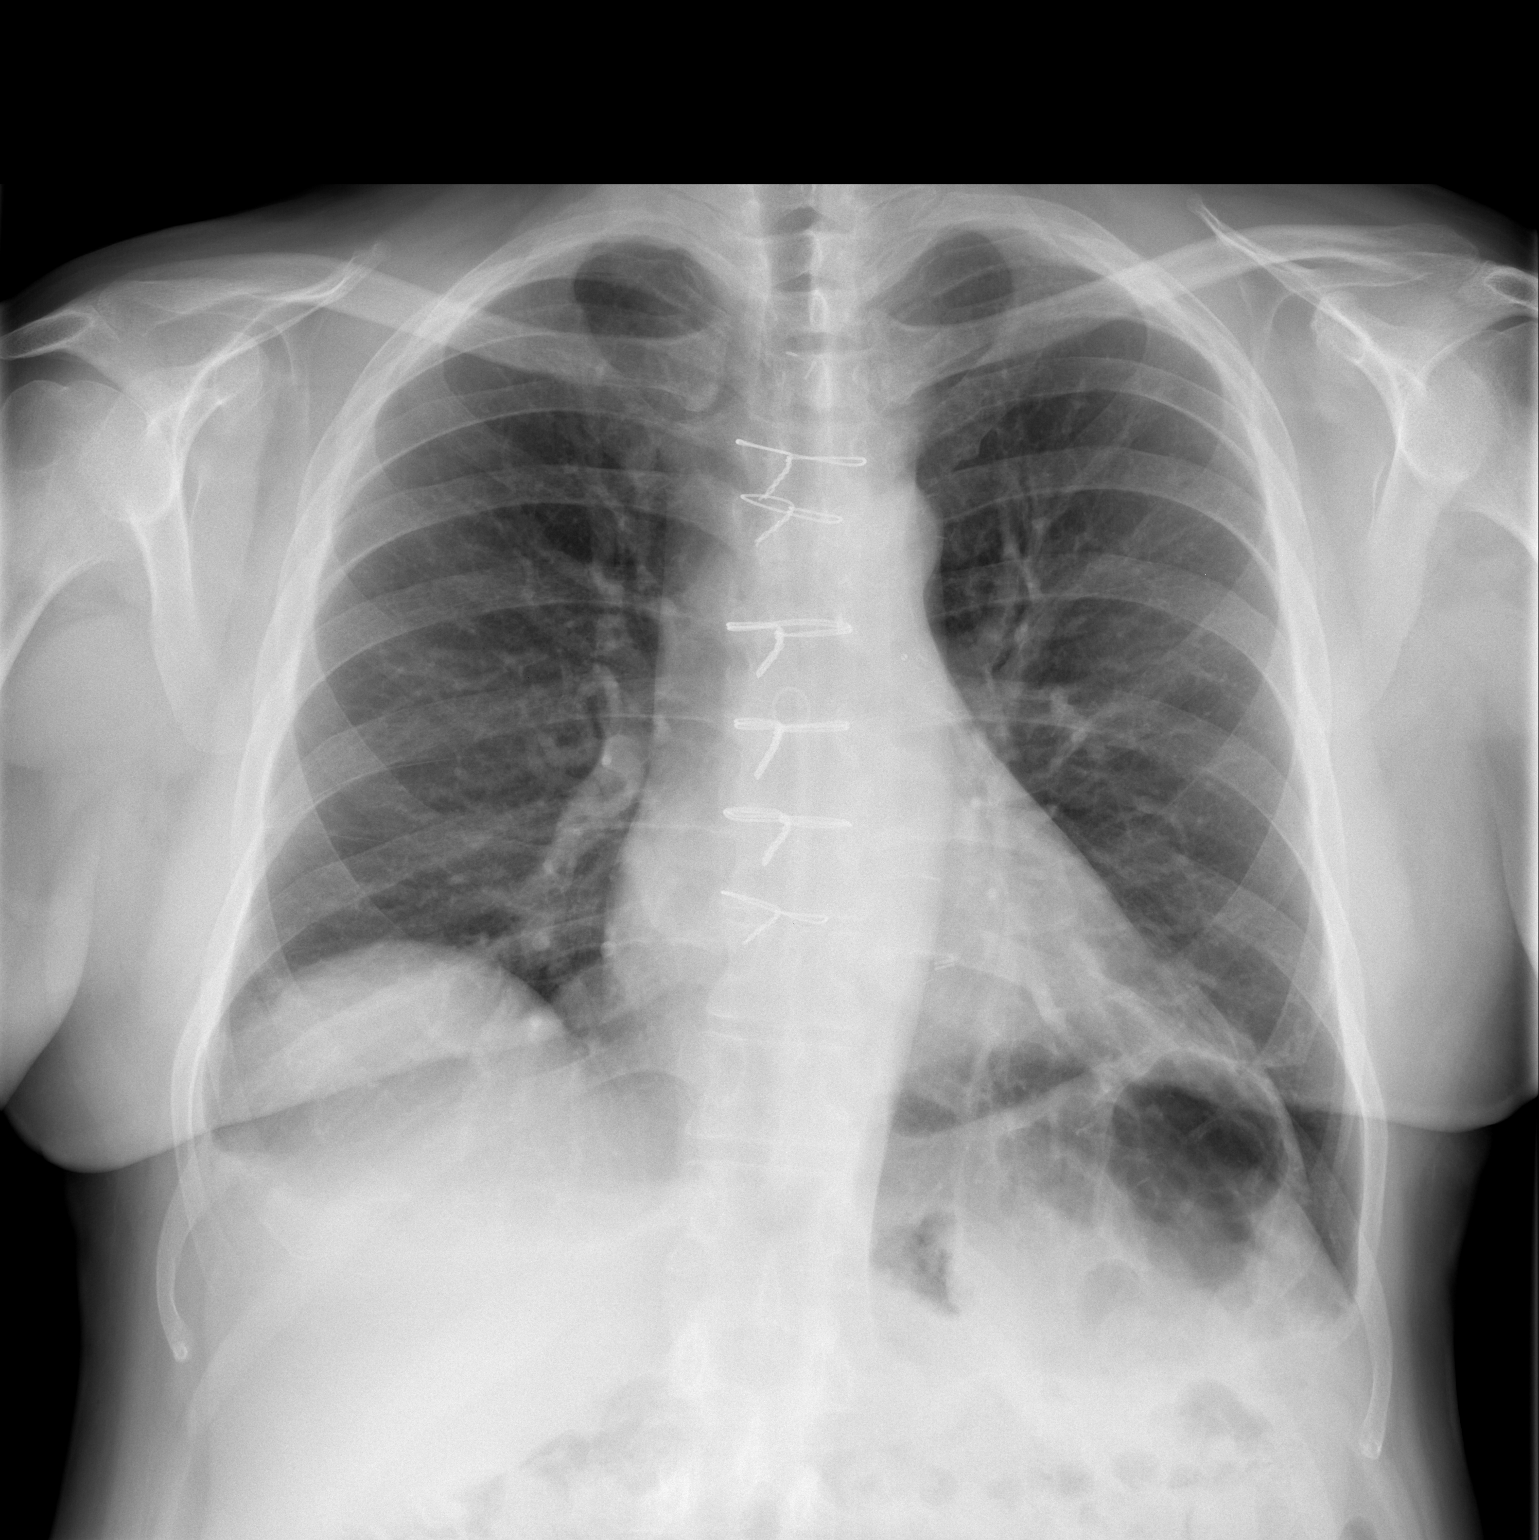

[w chest lat]
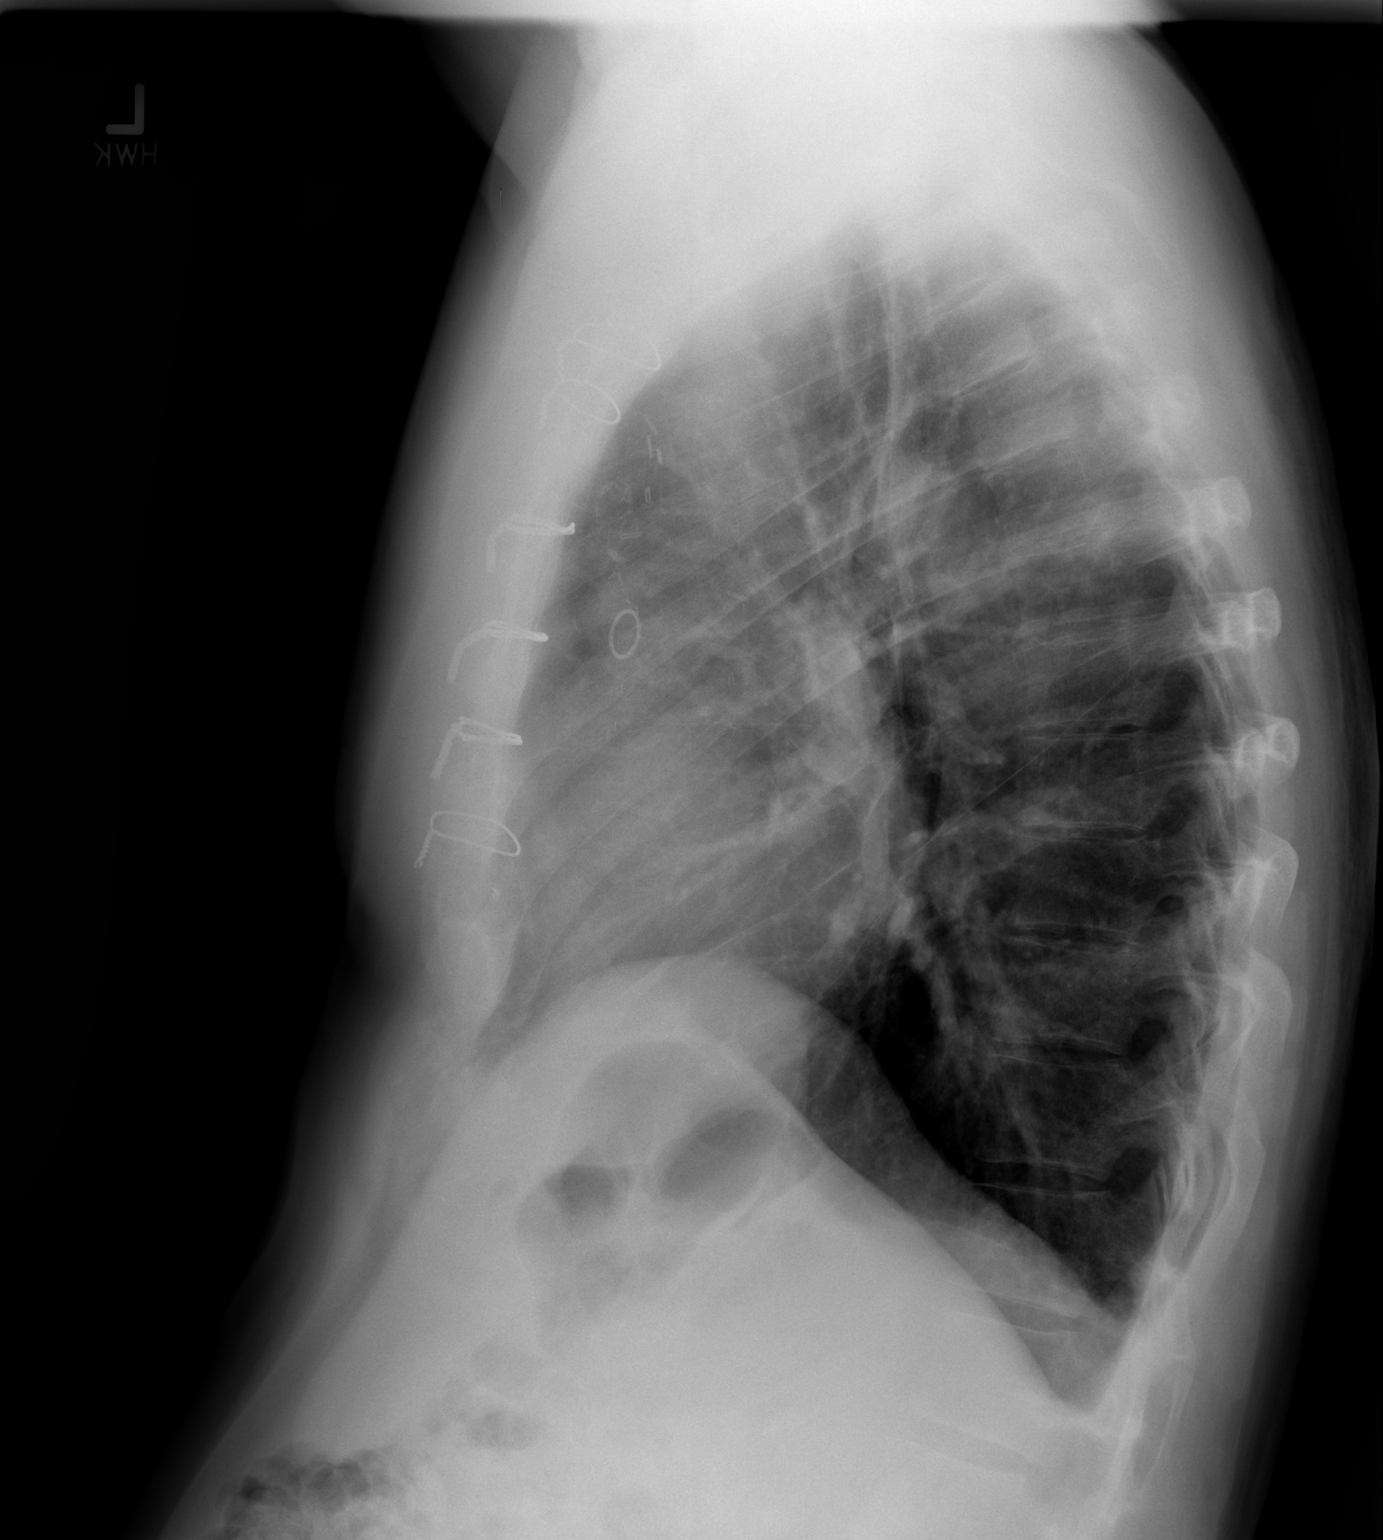

[2 of 2 positions shown; findings below may reference images not displayed]

FINDINGS: There is no edema or consolidation. The heart size and pulmonary
vascularity are normal. No adenopathy. Patient is status post
coronary artery bypass grafting. No bone lesions.
IMPRESSION: No edema or consolidation.

## 2015-07-11 ENCOUNTER — Encounter: Payer: Self-pay | Admitting: Interventional Cardiology

## 2015-08-29 ENCOUNTER — Other Ambulatory Visit: Payer: Self-pay | Admitting: Interventional Cardiology

## 2015-09-18 ENCOUNTER — Other Ambulatory Visit: Payer: Self-pay | Admitting: Interventional Cardiology

## 2015-09-20 ENCOUNTER — Other Ambulatory Visit: Payer: Self-pay | Admitting: Interventional Cardiology

## 2015-10-23 ENCOUNTER — Other Ambulatory Visit: Payer: Self-pay | Admitting: Interventional Cardiology

## 2015-10-25 ENCOUNTER — Other Ambulatory Visit: Payer: Self-pay | Admitting: Interventional Cardiology

## 2015-11-07 ENCOUNTER — Ambulatory Visit (INDEPENDENT_AMBULATORY_CARE_PROVIDER_SITE_OTHER): Payer: Medicare Other | Admitting: Interventional Cardiology

## 2015-11-07 ENCOUNTER — Encounter: Payer: Self-pay | Admitting: Interventional Cardiology

## 2015-11-07 VITALS — BP 128/80 | HR 59 | Ht 68.0 in | Wt 197.4 lb

## 2015-11-07 DIAGNOSIS — I10 Essential (primary) hypertension: Secondary | ICD-10-CM

## 2015-11-07 DIAGNOSIS — E785 Hyperlipidemia, unspecified: Secondary | ICD-10-CM | POA: Diagnosis not present

## 2015-11-07 DIAGNOSIS — I25119 Atherosclerotic heart disease of native coronary artery with unspecified angina pectoris: Secondary | ICD-10-CM

## 2015-11-07 NOTE — Patient Instructions (Signed)
Medication Instructions:  Your physician recommends that you continue on your current medications as directed. Please refer to the Current Medication list given to you today.   Labwork: Your physician recommends that you return for a FASTING lipid profile and lft on 11/10/15   Testing/Procedures: None ordered  Follow-Up: Your physician wants you to follow-up in: 1 year with Dr.Smith You will receive a reminder letter in the mail two months in advance. If you don't receive a letter, please call our office to schedule the follow-up appointment.   Any Other Special Instructions Will Be Listed Below (If Applicable). Your physician discussed the importance of regular exercise and recommended that you start or continue a regular exercise program for good health.  Your physician encouraged you to lose weight for better health.       If you need a refill on your cardiac medications before your next appointment, please call your pharmacy.

## 2015-11-07 NOTE — Progress Notes (Signed)
Cardiology Office Note   Date:  11/07/2015   ID:  Eric Warren, DOB May 30, 1951, MRN [id number]  PCP:  Vena Austria, MD  Cardiologist:  Sinclair Grooms, MD   Chief Complaint  Patient presents with  . Coronary Artery Disease      History of Present Illness: Eric Warren is a 65 y.o. male who presents for Coronary artery disease, non-ST elevation myocardial infarction March 2015 and multivessel coronary bypass grafting March 2015 with LIMA to LAD and several saphenous vein grafts.  The patient is doing well. He has no cardiac complaints. We spent time discussing secondary prevention.  Past Medical History  Diagnosis Date  . Myocardial infarction ([facility])     non-STEMI 08/2013, CABG x 3 09/25/2013    Past Surgical History  Procedure Laterality Date  . Coronary artery bypass graft N/A 09/25/2013    Procedure: CORONARY ARTERY BYPASS GRAFTING (CABG);  Surgeon: Melrose Nakayama, MD;  [facility];  Service: Open Heart Surgery;  Laterality: N/A;  Times 3 using left internal mammary artery and endoscopically harvested right saphenous vein.  . Intraoperative transesophageal echocardiogram N/A 09/25/2013    Procedure: INTRAOPERATIVE TRANSESOPHAGEAL ECHOCARDIOGRAM;  Surgeon: Melrose Nakayama, MD;  Location: [facility];  Service: Open Heart Surgery;  Laterality: N/A;  . Left heart catheterization with coronary angiogram N/A 09/25/2013    Procedure: LEFT HEART CATHETERIZATION WITH CORONARY ANGIOGRAM;  Surgeon: Birdie Riddle, MD;  Location: [facility];  Service: Cardiovascular;  Laterality: N/A;     Current Outpatient Prescriptions  Medication Sig Dispense Refill  . aspirin EC 325 MG EC tablet Take 1 tablet (325 mg total) by mouth daily. 30 tablet 0  . beta carotene w/minerals (OCUVITE) tablet Take 1 tablet by mouth daily.    . Cholecalciferol (VITAMIN D3) 2000 UNITS TABS Take 1 tablet by mouth daily.    Marland Kitchen lisinopril (PRINIVIL,ZESTRIL) 5 MG tablet TAKE 1 TABLET BY MOUTH  EVERY DAY 90 tablet 0  . metoprolol succinate (TOPROL-XL) 100 MG 24 hr tablet TAKE 1 TABLET BY MOUTH DAILY WITH A MEAL 30 tablet 2  . simvastatin (ZOCOR) 40 MG tablet Take 1 tablet (40 mg total) by mouth daily. 30 tablet 2   No current facility-administered medications for this visit.    Allergies:   Review of patient's allergies indicates no known allergies.    Social History:  The patient  reports that he has quit smoking. He has never used smokeless tobacco. He reports that he does not drink alcohol or use illicit drugs.   Family History:  The patient's family history includes Coronary artery disease (age of onset: 40) in his father; Heart attack in his father; Heart disease in his father.    ROS:  Please see the history of present illness.   Otherwise, review of systems are positive for dyspnea on exertion and difficulty with vision. 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   All other systems are reviewed and negative.    PHYSICAL EXAM: VS:  BP 128/80 mmHg  Pulse 59  Ht 5\' 8"  (1.727 m)  Wt 197 lb 6.4 oz (89.54 kg)  BMI 30.02 kg/m2 , BMI Body mass index is 30.02 kg/(m^2). GEN: Well nourished, well developed, in no acute distress HEENT: normal Neck: no JVD, carotid bruits, or masses Cardiac: R RR.  There is no murmur, rub, or gallop. There is no edema. Respiratory:  clear to auscultation bilaterally, normal work of breathing. GI: soft, nontender, nondistended, + BS MS:  no deformity or atrophy Skin: warm and dry, no rash Neuro:  Strength and sensation are intact Psych: euthymic mood, full affect   EKG:  EKG is ordered today. The ekg reveals sinus bradycardia at 59 bpm, incomplete right bundle, otherwise unremarkable.   Recent Labs: No results found for requested labs within last 365 days.    Lipid Panel    Component Value Date/Time   CHOL 168 11/01/2014 0843   TRIG 102.0 11/01/2014 0843   HDL 31.80* 11/01/2014 0843   CHOLHDL 5 11/01/2014 0843     VLDL 20.4 11/01/2014 0843   LDLCALC 116* 11/01/2014 0843      Wt Readings from Last 3 Encounters:  11/07/15 197 lb 6.4 oz (89.54 kg)  08/23/14 198 lb 3.2 oz (89.903 kg)  05/05/14 185 lb (83.915 kg)      Other studies Reviewed: Additional studies/ records that were reviewed today include: Reviewed coronary angiography prior to bypass grafting. The findings include:  Coronary angiography conclusions March 2015 IMPRESSION OF HEART CATHETERIZATION:  1. Normal left main coronary artery is very short. 2. 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 3. Moderate to severe osteal disease left circumflex artery and small branches. 4. Mild disease of dominant right coronary artery. 5. Normal left ventricular systolic function. LVEDP 16 mmHg. Ejection fraction 60-70 %. 6. Patent LIMA on left subclavian injection.    Artery bypass grafting, March 2015:  INTRAOPERATIVE TRANSESOPHAGEAL ECHOCARDIOGRAM,  URGENT CORONARY ARTERY BYPASS GRAFTING (CABG) x 3 LIMA to LAD SVG SEQUENTIALLY to DIAGONAL 1 and OM 1 Endoscopic vein harvest right thigh    ASSESSMENT AND PLAN:  1. Coronary artery disease with unspecified angina pectoris Stable without angina. Relatively sedentary.  2. Essential hypertension Good control on current medical regimen  3. Hyperlipidemia We follow the patient's lipid panel. It is not been performed yet this year.    Current medicines are reviewed at length with the patient today.  The patient has the following concerns regarding medicines: .  The following changes/actions have been instituted:    Increase aerobic activity  Weight loss with a goal weight less than 182 pounds  Liver and lipid panel  Labs/ tests ordered today include:  No orders of the defined types were placed in this encounter.     Disposition:   FU  with HS in 1 year  Signed, Sinclair Grooms, MD  11/07/2015 11:25 AM    [facility] [address], [place], [place]  [address] Phone: [phone]; Fax: [phone]

## 2015-11-10 ENCOUNTER — Other Ambulatory Visit: Payer: Self-pay

## 2015-11-10 ENCOUNTER — Other Ambulatory Visit (INDEPENDENT_AMBULATORY_CARE_PROVIDER_SITE_OTHER): Payer: Medicare Other | Admitting: *Deleted

## 2015-11-10 DIAGNOSIS — E785 Hyperlipidemia, unspecified: Secondary | ICD-10-CM

## 2015-11-10 LAB — LIPID PANEL
CHOL/HDL RATIO: 4.3 ratio (ref <=5.0)
Cholesterol: 175 mg/dL (ref 125–200)
HDL: 41 mg/dL (ref >=40)
LDL CALC: 119 mg/dL (ref <130)
Triglycerides: 77 mg/dL (ref <150)
VLDL: 15 mg/dL (ref <30)

## 2015-11-10 LAB — HEPATIC FUNCTION PANEL
ALBUMIN: 4 g/dL (ref 3.6–5.1)
ALK PHOS: 46 U/L (ref 40–115)
ALT: 21 U/L (ref 9–46)
AST: 18 U/L (ref 10–35)
BILIRUBIN TOTAL: 0.7 mg/dL (ref 0.2–1.2)
Bilirubin, Direct: 0.1 mg/dL (ref <=0.2)
Indirect Bilirubin: 0.6 mg/dL (ref 0.2–1.2)
Total Protein: 6 g/dL — ABNORMAL LOW (ref 6.1–8.1)

## 2015-12-12 ENCOUNTER — Other Ambulatory Visit: Payer: Self-pay | Admitting: Interventional Cardiology

## 2015-12-31 ENCOUNTER — Other Ambulatory Visit: Payer: Self-pay | Admitting: Interventional Cardiology

## 2016-04-24 ENCOUNTER — Other Ambulatory Visit: Payer: Self-pay | Admitting: Family Medicine

## 2016-04-24 DIAGNOSIS — Z136 Encounter for screening for cardiovascular disorders: Secondary | ICD-10-CM

## 2016-06-04 ENCOUNTER — Ambulatory Visit
Admission: RE | Admit: 2016-06-04 | Discharge: 2016-06-04 | Disposition: A | Discharge status: Home / Self Care | Payer: Medicare Other | Source: Ambulatory Visit | Attending: Family Medicine | Admitting: Family Medicine

## 2016-06-04 DIAGNOSIS — Z136 Encounter for screening for cardiovascular disorders: Secondary | ICD-10-CM

## 2016-07-27 ENCOUNTER — Telehealth: Payer: Self-pay | Admitting: Interventional Cardiology

## 2016-07-27 NOTE — Telephone Encounter (Signed)
Routed to requesting office.

## 2016-07-27 NOTE — Telephone Encounter (Signed)
Request for surgical clearance:  1. What type of surgery is being performed? Cataract Extraction with intraocular lens implantation of the right eye followed by the left   2. When is this surgery scheduled? 09/03/16   3. Are there any medications that need to be held prior to surgery and how long? None   4. Name of physician performing surgery? Dr. Alden Server   5. What is your office phone and fax number? [phone] Fax [phone] Phone   Pt will have topical anesthesia for procedure.

## 2016-07-27 NOTE — Telephone Encounter (Signed)
No worries. Cleared for upcoming surgery.

## 2016-09-11 ENCOUNTER — Telehealth: Payer: Self-pay | Admitting: Interventional Cardiology

## 2016-09-11 NOTE — Telephone Encounter (Signed)
New Message:    Pt is scheduled to see Dr Tamala Julian on 11-08-16. Pt would like to have his lab work,before his appt.Please order this for him and notify him when he needs to do it.

## 2016-09-11 NOTE — Telephone Encounter (Signed)
Pt would like to have fasting lipid and liver function before yearly f/u in May.  Scheduled for 5/1 prior to appt scheduled for 5/10. Pt thanks me for helping with this.

## 2016-10-18 ENCOUNTER — Encounter: Payer: Self-pay | Admitting: Interventional Cardiology

## 2016-10-23 ENCOUNTER — Other Ambulatory Visit: Payer: Self-pay | Admitting: Interventional Cardiology

## 2016-10-30 ENCOUNTER — Other Ambulatory Visit: Payer: Medicare Other | Admitting: *Deleted

## 2016-10-30 DIAGNOSIS — I10 Essential (primary) hypertension: Secondary | ICD-10-CM

## 2016-10-30 DIAGNOSIS — E785 Hyperlipidemia, unspecified: Secondary | ICD-10-CM

## 2016-10-30 LAB — HEPATIC FUNCTION PANEL
ALBUMIN: 4.4 g/dL (ref 3.6–4.8)
ALT: 21 IU/L (ref 0–44)
AST: 18 IU/L (ref 0–40)
Alkaline Phosphatase: 47 IU/L (ref 39–117)
Bilirubin Total: 0.6 mg/dL (ref 0.0–1.2)
Bilirubin, Direct: 0.15 mg/dL (ref 0.00–0.40)
Total Protein: 6.4 g/dL (ref 6.0–8.5)

## 2016-10-30 LAB — LIPID PANEL
CHOLESTEROL TOTAL: 177 mg/dL (ref 100–199)
Chol/HDL Ratio: 4.7 ratio (ref 0.0–5.0)
HDL: 38 mg/dL — AB (ref >39)
LDL CALC: 115 mg/dL — AB (ref 0–99)
Triglycerides: 122 mg/dL (ref 0–149)
VLDL CHOLESTEROL CAL: 24 mg/dL (ref 5–40)

## 2016-11-01 ENCOUNTER — Telehealth: Payer: Self-pay | Admitting: *Deleted

## 2016-11-01 DIAGNOSIS — E7849 Other hyperlipidemia: Secondary | ICD-10-CM

## 2016-11-01 MED ORDER — ROSUVASTATIN CALCIUM 40 MG PO TABS: 40.0000 mg | ORAL_TABLET | Freq: Every day | ORAL | 3 refills | Status: DC

## 2016-11-01 NOTE — Telephone Encounter (Signed)
Informed pt of lab results and recommendations per Dr. Tamala Julian.  Pt verbalized understanding and was in agreement with this plan.  Pt will have repeat labs on 12/18/16.

## 2016-11-01 NOTE — Telephone Encounter (Signed)
-----   Message from Belva Crome, MD sent at 10/30/2016 10:57 PM EDT ----- Let the patient know LDL is not optimal. Needs to change to Rosuvastatin 40 mg daily. Lipid and liber in 6 weeks A copy will be sent to Vena Austria, MD

## 2016-11-08 ENCOUNTER — Encounter: Payer: Self-pay | Admitting: Interventional Cardiology

## 2016-11-08 ENCOUNTER — Ambulatory Visit (INDEPENDENT_AMBULATORY_CARE_PROVIDER_SITE_OTHER): Payer: Medicare Other | Admitting: Interventional Cardiology

## 2016-11-08 VITALS — BP 130/84 | HR 57 | Ht 68.0 in | Wt 198.8 lb

## 2016-11-08 DIAGNOSIS — I10 Essential (primary) hypertension: Secondary | ICD-10-CM | POA: Diagnosis not present

## 2016-11-08 DIAGNOSIS — I25709 Atherosclerosis of coronary artery bypass graft(s), unspecified, with unspecified angina pectoris: Secondary | ICD-10-CM

## 2016-11-08 DIAGNOSIS — E7849 Other hyperlipidemia: Secondary | ICD-10-CM

## 2016-11-08 DIAGNOSIS — E784 Other hyperlipidemia: Secondary | ICD-10-CM

## 2016-11-08 MED ORDER — ASPIRIN EC 81 MG PO TBEC: 81.0000 mg | DELAYED_RELEASE_TABLET | Freq: Every day | ORAL | 3 refills | Status: AC

## 2016-11-08 NOTE — Patient Instructions (Signed)
Medication Instructions:  1) DECREASE Aspirin to 81mg once daily  Labwork: None  Testing/Procedures: None  Follow-Up: Your physician wants you to follow-up in: 1 year with Dr. Smith.  You will receive a reminder letter in the mail two months in advance. If you don't receive a letter, please call our office to schedule the follow-up appointment.   Any Other Special Instructions Will Be Listed Below (If Applicable).     If you need a refill on your cardiac medications before your next appointment, please call your pharmacy.   

## 2016-11-08 NOTE — Progress Notes (Signed)
Cardiology Office Note    Date:  11/08/2016   ID:  Eric Warren, DOB 01-Apr-1951, MRN [id number]  PCP:  Maury Dus, MD  Cardiologist: Sinclair Grooms, MD   Chief Complaint  Patient presents with  . Coronary Artery Disease    History of Present Illness:  Eric Warren is a 66 y.o. male who presents for Coronary artery disease, non-ST elevation myocardial infarction March 2015    multivessel coronary bypass grafting March 2015 (LIMA to LAD and sequential saphenous vein graft to first diagonal and obtuse marginal ).  He is doing well. No angina. Has multiple questions concerning prevention. He denies similar symptoms to those present at the time of his non-ST elevation MI in 2015. At that time and felt as if a large bite of an apple or carrot was stuck in his esophagus causing chest discomfort. He also had nausea and weakness. This is not recurred. He remains active working on his property. He does not exercise regularly other than related to yardwork.  Past Medical History:  Diagnosis Date  . Myocardial infarction ([place])    non-STEMI 08/2013, CABG x 3 09/25/2013    Past Surgical History:  Procedure Laterality Date  . CORONARY ARTERY BYPASS GRAFT N/A 09/25/2013   Procedure: CORONARY ARTERY BYPASS GRAFTING (CABG);  Surgeon: Melrose Nakayama, MD;  Location: [facility];  Service: Open Heart Surgery;  Laterality: N/A;  Times 3 using left internal mammary artery and endoscopically harvested right saphenous vein.  . INTRAOPERATIVE TRANSESOPHAGEAL ECHOCARDIOGRAM N/A 09/25/2013   Procedure: INTRAOPERATIVE TRANSESOPHAGEAL ECHOCARDIOGRAM;  Surgeon: Melrose Nakayama, MD;  Location: [facility];  Service: Open Heart Surgery;  Laterality: N/A;  . LEFT HEART CATHETERIZATION WITH CORONARY ANGIOGRAM N/A 09/25/2013   Procedure: LEFT HEART CATHETERIZATION WITH CORONARY ANGIOGRAM;  Surgeon: Birdie Riddle, MD;  Location: [facility];  Service: Cardiovascular;  Laterality: N/A;    Current  Medications: Outpatient Medications Prior to Visit  Medication Sig Dispense Refill  . beta carotene w/minerals (OCUVITE) tablet Take 1 tablet by mouth daily.    Marland Kitchen lisinopril (PRINIVIL,ZESTRIL) 5 MG tablet TAKE 1 TABLET BY MOUTH EVERY DAY 30 tablet 0  . metoprolol succinate (TOPROL-XL) 100 MG 24 hr tablet TAKE 1 TABLET BY MOUTH DAILY WITH A MEAL 30 tablet 0  . rosuvastatin (CRESTOR) 40 MG tablet Take 1 tablet (40 mg total) by mouth daily. 90 tablet 3  . aspirin EC 325 MG EC tablet Take 1 tablet (325 mg total) by mouth daily. 30 tablet 0  . Cholecalciferol (VITAMIN D3) 2000 UNITS TABS Take 1 tablet by mouth daily.     No facility-administered medications prior to visit.      Allergies:   Patient has no known allergies.   Social History   Social History  . Marital status: Married    Spouse name: N/A  . Number of children: N/A  . Years of education: N/A   Occupational History  . real estate [employer]   Social History Main Topics  . Smoking status: Former Research scientist (life sciences)  . Smokeless tobacco: Never Used  . Alcohol use No  . Drug use: No  . Sexual activity: Yes   Other Topics Concern  . None   Social History Narrative  . None     Family History:  The patient's family history includes Coronary artery disease (age of onset: 6) in his father; Heart attack in his father; Heart disease in his father.   ROS:   Please see the history  of present illness.    Increase weight. He is still in denial about his cardiac disease diagnosis. Wants to get off medication. All other systems reviewed and are negative.   PHYSICAL EXAM:   VS:  BP 130/84 (BP Location: Right Arm)   Pulse (!) 57   Ht 5\' 8"  (1.727 m)   Wt 198 lb 12.8 oz (90.2 kg)   BMI 30.23 kg/m    GEN: Well nourished, well developed, in no acute distress  HEENT: normal  Neck: no JVD, carotid bruits, or masses Cardiac: RRR; no murmurs, rubs, or gallops,no edema  Respiratory:  clear to auscultation bilaterally, normal work  of breathing GI: soft, nontender, nondistended, + BS MS: no deformity or atrophy  Skin: warm and dry, no rash Neuro:  Alert and Oriented x 3, Strength and sensation are intact Psych: euthymic mood, full affect  Wt Readings from Last 3 Encounters:  11/08/16 198 lb 12.8 oz (90.2 kg)  11/07/15 197 lb 6.4 oz (89.5 kg)  08/23/14 198 lb 3.2 oz (89.9 kg)      Studies/Labs Reviewed:   EKG:  EKG  Sinus bradycardia and otherwise unremarkable  Recent Labs: 10/30/2016: ALT 21   Lipid Panel    Component Value Date/Time   CHOL 177 10/30/2016 0916   TRIG 122 10/30/2016 0916   HDL 38 (L) 10/30/2016 0916   CHOLHDL 4.7 10/30/2016 0916   CHOLHDL 4.3 11/10/2015 0812   VLDL 15 11/10/2015 0812   LDLCALC 115 (H) 10/30/2016 0916    Additional studies/ records that were reviewed today include:  LDL cholesterol is not at target on simvastatin 40 mg per day. He has been switched to rosuvastatin 40 mg per day. A repeat liver and lipid panel will be done in 6-8 weeks later    ASSESSMENT:    1. 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 Essential hypertension   3. Other hyperlipidemia      PLAN:  In order of problems listed above:  1. He is status post LIMA to LAD and sequential vein graft to diagonal and obtuse marginal. He is asymptomatic. Lipids are not at target. He is relatively inactive. I have encouraged aerobic activity, decreased saturated fat in diet, and we have intensified statin therapy. 2. Target blood pressure 140/90 or less.  3. 2 g sodium diet discussed. Weight loss discussed. He is currently within a good blood pressure range. 4. LDL less than 70. Rosuvastatin recently started. Low-fat diet.   Clinical follow-up in one year. Aerobic activity. Weight loss. Low-fat diet.  Greater than 50% of this prolonged office visit was spent in counseling concerning risk factor modification.  Medication Adjustments/Labs and  Tests Ordered: Current medicines are reviewed at length with the patient today.  Concerns regarding medicines are outlined above.  Medication changes, Labs and Tests ordered today are listed in the Patient Instructions below. Patient Instructions  Medication Instructions:  1) DECREASE Aspirin to 81mg  once daily  Labwork: None  Testing/Procedures: None  Follow-Up: Your physician wants you to follow-up in: 1 year with Dr. Tamala Julian.  You will receive a reminder letter in the mail two months in advance. If you don't receive a letter, please call our office to schedule the follow-up appointment.   Any Other Special Instructions Will Be Listed Below (If Applicable).     If you need a refill on your cardiac medications before your next appointment, please call your pharmacy.  Signed, Sinclair Grooms, MD  11/08/2016 11:00 AM    [facility] [place], [place], [place]  [address] Phone: [phone]; Fax: [phone]

## 2016-11-25 ENCOUNTER — Other Ambulatory Visit: Payer: Self-pay | Admitting: Interventional Cardiology

## 2016-12-18 ENCOUNTER — Other Ambulatory Visit: Payer: Medicare Other | Admitting: *Deleted

## 2016-12-18 DIAGNOSIS — E7849 Other hyperlipidemia: Secondary | ICD-10-CM

## 2016-12-18 LAB — LIPID PANEL
CHOL/HDL RATIO: 3.3 ratio (ref 0.0–5.0)
CHOLESTEROL TOTAL: 130 mg/dL (ref 100–199)
HDL: 39 mg/dL — ABNORMAL LOW (ref >39)
LDL Calculated: 78 mg/dL (ref 0–99)
Triglycerides: 65 mg/dL (ref 0–149)
VLDL Cholesterol Cal: 13 mg/dL (ref 5–40)

## 2016-12-18 LAB — HEPATIC FUNCTION PANEL
ALBUMIN: 4.2 g/dL (ref 3.6–4.8)
ALT: 22 IU/L (ref 0–44)
AST: 22 IU/L (ref 0–40)
Alkaline Phosphatase: 46 IU/L (ref 39–117)
BILIRUBIN TOTAL: 0.5 mg/dL (ref 0.0–1.2)
Bilirubin, Direct: 0.15 mg/dL (ref 0.00–0.40)
Total Protein: 6.2 g/dL (ref 6.0–8.5)

## 2017-10-21 ENCOUNTER — Other Ambulatory Visit: Payer: Self-pay | Admitting: Interventional Cardiology

## 2017-11-22 ENCOUNTER — Other Ambulatory Visit: Payer: Self-pay | Admitting: Interventional Cardiology

## 2017-12-15 NOTE — Progress Notes (Signed)
Cardiology Office Note    Date:  12/16/2017   ID:  Eric Warren, DOB July 11, 1950, MRN [id number]  PCP:  Maury Dus, MD  Cardiologist: Sinclair Grooms, MD   Chief Complaint  Patient presents with  . Coronary Artery Disease    History of Present Illness:  Eric Warren is a 67 y.o. male who presents for Coronary artery disease, non-ST elevation myocardial infarction March 2015 and multivessel coronary bypass grafting March 2015 with LIMA to LAD and several saphenous vein grafts.  Not exercising.  States that it is because his wife is been having knee trouble.  Has some dyspnea when he works in his yard.  Not as physically fit this year as last because he has not been walking.  He denies angina.  No nitroglycerin use.   Past Medical History:  Diagnosis Date  . Myocardial infarction ([place])    non-STEMI 08/2013, CABG x 3 09/25/2013    Past Surgical History:  Procedure Laterality Date  . CORONARY ARTERY BYPASS GRAFT N/A 09/25/2013   Procedure: CORONARY ARTERY BYPASS GRAFTING (CABG);  Surgeon: Melrose Nakayama, MD;  Location: [facility];  Service: Open Heart Surgery;  Laterality: N/A;  Times 3 using left internal mammary artery and endoscopically harvested right saphenous vein.  . INTRAOPERATIVE TRANSESOPHAGEAL ECHOCARDIOGRAM N/A 09/25/2013   Procedure: INTRAOPERATIVE TRANSESOPHAGEAL ECHOCARDIOGRAM;  Surgeon: Melrose Nakayama, MD;  Location: [facility];  Service: Open Heart Surgery;  Laterality: N/A;  . LEFT HEART CATHETERIZATION WITH CORONARY ANGIOGRAM N/A 09/25/2013   Procedure: LEFT HEART CATHETERIZATION WITH CORONARY ANGIOGRAM;  Surgeon: Birdie Riddle, MD;  Location: [facility];  Service: Cardiovascular;  Laterality: N/A;    Current Medications: Outpatient Medications Prior to Visit  Medication Sig Dispense Refill  . aspirin EC 81 MG tablet Take 1 tablet (81 mg total) by mouth daily. 90 tablet 3  . beta carotene w/minerals (OCUVITE) tablet Take 1 tablet by mouth daily.    Marland Kitchen  lisinopril (PRINIVIL,ZESTRIL) 5 MG tablet Take 1 tablet (5 mg total) by mouth daily. Please keep upcoming appt in August with Dr. Tamala Julian for future refills. Thank you 90 tablet 0  . metoprolol succinate (TOPROL-XL) 100 MG 24 hr tablet Take 1 tablet (100 mg total) by mouth daily. Please keep upcoming appt in August with Dr. Tamala Julian for future refills. Thank you 90 tablet 0  . rosuvastatin (CRESTOR) 40 MG tablet Take 1 tablet (40 mg total) by mouth daily. Please keep upcoming appt in August with Dr. Tamala Julian for future refills. Thank you 90 tablet 0   No facility-administered medications prior to visit.      Allergies:   Patient has no known allergies.   Social History   Socioeconomic History  . Marital status: Married    Spouse name: Not on file  . Number of children: Not on file  . Years of education: Not on file  . Highest education level: Not on file  Occupational History  . Occupation: Sports coach: [employer]  . Financial resource strain: Not on file  . Food insecurity:    Worry: Not on file    Inability: Not on file  . Transportation needs:    Medical: Not on file    Non-medical: Not on file  Tobacco Use  . Smoking status: Former Research scientist (life sciences)  . Smokeless tobacco: Never Used  Substance and Sexual Activity  . Alcohol use: No    Alcohol/week: 0.0 oz  . Drug use:  No  . Sexual activity: Yes  Lifestyle  . Physical activity:    Days per week: Not on file    Minutes per session: Not on file  . Stress: Not on file  Relationships  . Social connections:    Talks on phone: Not on file    Gets together: Not on file    Attends religious service: Not on file    Active member of club or organization: Not on file    Attends meetings of clubs or organizations: Not on file    Relationship status: Not on file  Other Topics Concern  . Not on file  Social History Narrative  . Not on file     Family History:  The patient's family history includes Coronary  artery disease (age of onset: 48) in his father; Heart attack in his father; Heart disease in his father.   ROS:   Please see the history of present illness.    Slight decrease in memory.  Sedentary. All other systems reviewed and are negative.   PHYSICAL EXAM:   VS:  BP 134/80 (BP Location: Right Arm, Patient Position: Sitting, Cuff Size: Normal)   Pulse 61   Ht 5\' 8"  (1.727 m)   Wt 191 lb 12.8 oz (87 kg)   SpO2 97%   BMI 29.16 kg/m    GEN: Well nourished, well developed, in no acute distress  HEENT: normal  Neck: no JVD, carotid bruits, or masses Cardiac: RRR; no murmurs, rubs, or gallops,no edema  Respiratory:  clear to auscultation bilaterally, normal work of breathing GI: soft, nontender, nondistended, + BS MS: no deformity or atrophy  Skin: warm and dry, no rash Neuro:  Alert and Oriented x 3, Strength and sensation are intact Psych: euthymic mood, full affect  Wt Readings from Last 3 Encounters:  12/16/17 191 lb 12.8 oz (87 kg)  11/08/16 198 lb 12.8 oz (90.2 kg)  11/07/15 197 lb 6.4 oz (89.5 kg)      Studies/Labs Reviewed:   EKG:  EKG normal sinus rhythm.  Recent Labs: 12/18/2016: ALT 22   Lipid Panel    Component Value Date/Time   CHOL 130 12/18/2016 0920   TRIG 65 12/18/2016 0920   HDL 39 (L) 12/18/2016 0920   CHOLHDL 3.3 12/18/2016 0920   CHOLHDL 4.3 11/10/2015 0812   VLDL 15 11/10/2015 0812   LDLCALC 78 12/18/2016 0920    Additional studies/ records that were reviewed today include:  No new functional studies have been performed.    ASSESSMENT:    1. Coronary artery disease involving coronary bypass graft of native heart with angina pectoris ([place])   2. Essential hypertension   3. Other hyperlipidemia      PLAN:  In order of problems listed above:  1. Stable without angina 2. Target blood pressure 130/80 mmHg 3. LDL target less than 70.  I have encouraged an increase in aerobic activity.  We discussed the correlation between increased  aerobic activity and survival.  He will call if any angina.  Clinical follow-up in 1 year.    Medication Adjustments/Labs and Tests Ordered: Current medicines are reviewed at length with the patient today.  Concerns regarding medicines are outlined above.  Medication changes, Labs and Tests ordered today are listed in the Patient Instructions below. Patient Instructions  Medication Instructions:  Your physician recommends that you continue on your current medications as directed. Please refer to the Current Medication list given to you today.  Labwork: BMET, Lipid and Liver  today  Testing/Procedures: None  Follow-Up: Your physician wants you to follow-up in: 1 year with Dr. Tamala Julian.  You will receive a reminder letter in the mail two months in advance. If you don't receive a letter, please call our office to schedule the follow-up appointment.   Any Other Special Instructions Will Be Listed Below (If Applicable).  Your physician discussed the importance of regular exercise and recommended that you start or continue a regular exercise program for good health.    If you need a refill on your cardiac medications before your next appointment, please call your pharmacy.      Signed, Sinclair Grooms, MD  12/16/2017 1:29 PM    [facility] [place], [place], [place]  [address] Phone: [phone]; Fax: [phone]

## 2017-12-16 ENCOUNTER — Encounter

## 2017-12-16 ENCOUNTER — Encounter: Payer: Self-pay | Admitting: Interventional Cardiology

## 2017-12-16 ENCOUNTER — Ambulatory Visit: Payer: Medicare Other | Admitting: Interventional Cardiology

## 2017-12-16 VITALS — BP 134/80 | HR 61 | Ht 68.0 in | Wt 191.8 lb

## 2017-12-16 DIAGNOSIS — E7849 Other hyperlipidemia: Secondary | ICD-10-CM | POA: Diagnosis not present

## 2017-12-16 DIAGNOSIS — I25709 Atherosclerosis of coronary artery bypass graft(s), unspecified, with unspecified angina pectoris: Secondary | ICD-10-CM | POA: Diagnosis not present

## 2017-12-16 DIAGNOSIS — I10 Essential (primary) hypertension: Secondary | ICD-10-CM | POA: Diagnosis not present

## 2017-12-16 LAB — HEPATIC FUNCTION PANEL
ALT: 20 IU/L (ref 0–44)
AST: 20 IU/L (ref 0–40)
Albumin: 4.5 g/dL (ref 3.6–4.8)
Alkaline Phosphatase: 48 IU/L (ref 39–117)
Bilirubin Total: 0.7 mg/dL (ref 0.0–1.2)
Bilirubin, Direct: 0.23 mg/dL (ref 0.00–0.40)
Total Protein: 6.5 g/dL (ref 6.0–8.5)

## 2017-12-16 LAB — BASIC METABOLIC PANEL
BUN/Creatinine Ratio: 18 (ref 10–24)
BUN: 16 mg/dL (ref 8–27)
CALCIUM: 9.5 mg/dL (ref 8.6–10.2)
CO2: 24 mmol/L (ref 20–29)
Chloride: 104 mmol/L (ref 96–106)
Creatinine, Ser: 0.91 mg/dL (ref 0.76–1.27)
GFR calc Af Amer: 100 mL/min/{1.73_m2} (ref >59)
GFR, EST NON AFRICAN AMERICAN: 87 mL/min/{1.73_m2} (ref >59)
Glucose: 104 mg/dL — ABNORMAL HIGH (ref 65–99)
POTASSIUM: 4.1 mmol/L (ref 3.5–5.2)
Sodium: 142 mmol/L (ref 134–144)

## 2017-12-16 LAB — LIPID PANEL
Chol/HDL Ratio: 2.9 ratio (ref 0.0–5.0)
Cholesterol, Total: 129 mg/dL (ref 100–199)
HDL: 45 mg/dL (ref >39)
LDL Calculated: 73 mg/dL (ref 0–99)
Triglycerides: 56 mg/dL (ref 0–149)
VLDL CHOLESTEROL CAL: 11 mg/dL (ref 5–40)

## 2017-12-16 NOTE — Patient Instructions (Signed)
Medication Instructions:  Your physician recommends that you continue on your current medications as directed. Please refer to the Current Medication list given to you today.  Labwork: BMET, Lipid and Liver today  Testing/Procedures: None  Follow-Up: Your physician wants you to follow-up in: 1 year with Dr. Tamala Julian.  You will receive a reminder letter in the mail two months in advance. If you don't receive a letter, please call our office to schedule the follow-up appointment.   Any Other Special Instructions Will Be Listed Below (If Applicable).  Your physician discussed the importance of regular exercise and recommended that you start or continue a regular exercise program for good health.    If you need a refill on your cardiac medications before your next appointment, please call your pharmacy.

## 2018-02-14 ENCOUNTER — Ambulatory Visit: Payer: Medicare Other | Admitting: Interventional Cardiology

## 2018-02-20 ENCOUNTER — Other Ambulatory Visit: Payer: Self-pay | Admitting: Interventional Cardiology

## 2018-12-23 ENCOUNTER — Telehealth: Payer: Self-pay

## 2018-12-23 NOTE — Telephone Encounter (Signed)
    COVID-19 Pre-Screening Questions:  . In the past 7 to 10 days have you had a cough,  shortness of breath, headache, congestion, fever (100 or greater) body aches, chills, sore throat, or sudden loss of taste or sense of smell? No . Have you been around anyone with known Covid 19. NO . Have you been around anyone who is awaiting Covid 19 test results in the past 7 to 10 days? NO . Have you been around anyone who has been exposed to Covid 19, or has mentioned symptoms of Covid 19 within the past 7 to 10 days? NO  If you have any concerns/questions about symptoms patients report during screening (either on the phone or at threshold). Contact the provider seeing the patient or DOD for further guidance.  If neither are available contact a member of the leadership team.           

## 2018-12-24 ENCOUNTER — Ambulatory Visit: Payer: Medicare Other | Admitting: Interventional Cardiology

## 2018-12-24 ENCOUNTER — Encounter: Payer: Self-pay | Admitting: Interventional Cardiology

## 2018-12-24 ENCOUNTER — Other Ambulatory Visit: Payer: Self-pay

## 2018-12-24 VITALS — BP 132/78 | HR 57 | Ht 68.0 in | Wt 187.8 lb

## 2018-12-24 DIAGNOSIS — I10 Essential (primary) hypertension: Secondary | ICD-10-CM | POA: Diagnosis not present

## 2018-12-24 DIAGNOSIS — E7849 Other hyperlipidemia: Secondary | ICD-10-CM | POA: Diagnosis not present

## 2018-12-24 DIAGNOSIS — I25709 Atherosclerosis of coronary artery bypass graft(s), unspecified, with unspecified angina pectoris: Secondary | ICD-10-CM

## 2018-12-24 DIAGNOSIS — Z7189 Other specified counseling: Secondary | ICD-10-CM

## 2018-12-24 NOTE — Patient Instructions (Signed)
Medication Instructions:  Your physician recommends that you continue on your current medications as directed. Please refer to the Current Medication list given to you today.  If you need a refill on your cardiac medications before your next appointment, please call your pharmacy.   Lab work: BMET, Liver, Lipid, A1C, CPK, and ESR today  If you have labs (blood work) drawn today and your tests are completely normal, you will receive your results only by: Marland Kitchen MyChart Message (if you have MyChart) OR . A paper copy in the mail If you have any lab test that is abnormal or we need to change your treatment, we will call you to review the results.  Testing/Procedures: None  Follow-Up: At [facility], you and your health needs are our priority.  As part of our continuing mission to provide you with exceptional heart care, we have created designated Provider Care Teams.  These Care Teams include your primary Cardiologist (physician) and Advanced Practice Providers (APPs -  Physician Assistants and Nurse Practitioners) who all work together to provide you with the care you need, when you need it. You will need a follow up appointment in 12 months.  Please call our office 2 months in advance to schedule this appointment.  You may see Dr. Tamala Julian or one of the following Advanced Practice Providers on your designated Care Team:   Truitt Merle, NP Cecilie Kicks, NP . Kathyrn Drown, NP  Any Other Special Instructions Will Be Listed Below (If Applicable).

## 2018-12-24 NOTE — Progress Notes (Signed)
Cardiology Office Note:    Date:  12/24/2018   ID:  [name], DOB [birth year], MRN [id number]  PCP:  Maury Dus, MD  Cardiologist:  No primary care provider on file.   Referring MD: Maury Dus, MD   Chief Complaint  Patient presents with  . Coronary Artery Disease    History of Present Illness:    Eric Warren is a 68 y.o. male with a hx of Coronary artery disease, non-ST elevation myocardial infarction March 2015 and multivessel coronary bypass grafting March 2015 (LIMA to LAD, seq SVG to Diag and OM), hyperlipidemia, and prior smoker.  No cardiac complaints.  Relatively physically active although the pandemic has decreased free movement and activities.  He is having no medication side effects.  He is compliant with his current medical regimen.  No recent laboratory data.  Past Medical History:  Diagnosis Date  . Myocardial infarction ([place])    non-STEMI 08/2013, CABG x 3 09/25/2013    Past Surgical History:  Procedure Laterality Date  . CORONARY ARTERY BYPASS GRAFT N/A 09/25/2013   Procedure: CORONARY ARTERY BYPASS GRAFTING (CABG);  Surgeon: Melrose Nakayama, MD;  Location: [facility];  Service: Open Heart Surgery;  Laterality: N/A;  Times 3 using left internal mammary artery and endoscopically harvested right saphenous vein.  . INTRAOPERATIVE TRANSESOPHAGEAL ECHOCARDIOGRAM N/A 09/25/2013   Procedure: INTRAOPERATIVE TRANSESOPHAGEAL ECHOCARDIOGRAM;  Surgeon: Melrose Nakayama, MD;  Location: [facility];  Service: Open Heart Surgery;  Laterality: N/A;  . LEFT HEART CATHETERIZATION WITH CORONARY ANGIOGRAM N/A 09/25/2013   Procedure: LEFT HEART CATHETERIZATION WITH CORONARY ANGIOGRAM;  Surgeon: Birdie Riddle, MD;  Location: [facility];  Service: Cardiovascular;  Laterality: N/A;    Current Medications: Current Meds  Medication Sig  . aspirin EC 81 MG tablet Take 1 tablet (81 mg total) by mouth daily.  . beta carotene w/minerals (OCUVITE) tablet Take 1 tablet by mouth  daily.  Marland Kitchen lisinopril (PRINIVIL,ZESTRIL) 5 MG tablet Take 1 tablet (5 mg total) by mouth daily.  . metoprolol succinate (TOPROL-XL) 100 MG 24 hr tablet Take 1 tablet (100 mg total) by mouth daily.  . rosuvastatin (CRESTOR) 40 MG tablet Take 1 tablet (40 mg total) by mouth daily.     Allergies:   Patient has no known allergies.   Social History   Socioeconomic History  . Marital status: Married    Spouse name: Not on file  . Number of children: Not on file  . Years of education: Not on file  . Highest education level: Not on file  Occupational History  . Occupation: Sports coach: [employer]  . Financial resource strain: Not on file  . Food insecurity    Worry: Not on file    Inability: Not on file  . Transportation needs    Medical: Not on file    Non-medical: Not on file  Tobacco Use  . Smoking status: Former Research scientist (life sciences)  . Smokeless tobacco: Never Used  Substance and Sexual Activity  . Alcohol use: No    Alcohol/week: 0.0 standard drinks  . Drug use: No  . Sexual activity: Yes  Lifestyle  . Physical activity    Days per week: Not on file    Minutes per session: Not on file  . Stress: Not on file  Relationships  . Social Herbalist on phone: Not on file    Gets together: Not on file    Attends religious service:  Not on file    Active member of club or organization: Not on file    Attends meetings of clubs or organizations: Not on file    Relationship status: Not on file  Other Topics Concern  . Not on file  Social History Narrative  . Not on file     Family History: The patient's family history includes Coronary artery disease (age of onset: 80) in his father; Heart attack in his father; Heart disease in his father.  ROS:   Please see the history of present illness.    Muscle soreness in thoracic region.  Tendency to bend forward.  All other systems reviewed and are negative.  EKGs/Labs/Other Studies Reviewed:    The  following studies were reviewed today: No new data  EKG:  EKG normal sinus rhythm/sinus bradycardia.  Incomplete right bundle branch block.  When compared to prior tracing December 16, 2017, the [facility] prime is new.  Recent Labs: No results found for requested labs within last 8760 hours.  Recent Lipid Panel    Component Value Date/Time   CHOL 129 12/16/2017 0923   TRIG 56 12/16/2017 0923   HDL 45 12/16/2017 0923   CHOLHDL 2.9 12/16/2017 0923   CHOLHDL 4.3 11/10/2015 0812   VLDL 15 11/10/2015 0812   LDLCALC 73 12/16/2017 0923    Physical Exam:    VS:  BP 132/78   Pulse (!) 57   Ht 5\' 8"  (1.727 m)   Wt 187 lb 12.8 oz (85.2 kg)   SpO2 98%   BMI 28.55 kg/m     Wt Readings from Last 3 Encounters:  12/24/18 187 lb 12.8 oz (85.2 kg)  12/16/17 191 lb 12.8 oz (87 kg)  11/08/16 198 lb 12.8 oz (90.2 kg)     GEN: Moderate obesity. No acute distress HEENT: Normal NECK: No JVD. LYMPHATICS: No lymphadenopathy CARDIAC: RRR.  No murmur, no gallop, no edema VASCULAR: 2+ bilateral radial pulses, no bruits RESPIRATORY:  Clear to auscultation without rales, wheezing or rhonchi  ABDOMEN: Soft, non-tender, non-distended, No pulsatile mass, MUSCULOSKELETAL: No deformity  SKIN: Warm and dry NEUROLOGIC:  Alert and oriented x 3 PSYCHIATRIC:  Normal affect   ASSESSMENT:    1. Coronary artery disease involving coronary bypass graft of native heart with angina pectoris ([place])   2. Essential hypertension   3. Other hyperlipidemia   4. Educated About Covid-19 Virus Infection    PLAN:    In order of problems listed above:  1. Secondary risk prevention discussed in detail as outlined below. 2. Target 130/80 mmHg. 3. Target LDL less than 70. 4. Social distancing, masking, and washing his discussed in detail  Overall education and awareness concerning primary/secondary risk prevention was discussed in detail: LDL less than 70, hemoglobin A1c less than 7, blood pressure target less than 130/80  mmHg, >150 minutes of moderate aerobic activity per week, avoidance of smoking, weight control (via diet and exercise), and continued surveillance/management of/for obstructive sleep apnea.    Medication Adjustments/Labs and Tests Ordered: Current medicines are reviewed at length with the patient today.  Concerns regarding medicines are outlined above.  Orders Placed This Encounter  Procedures  . EKG 12-Lead   No orders of the defined types were placed in this encounter.   There are no Patient Instructions on file for this visit.   Signed, Sinclair Grooms, MD  12/24/2018 3:33 PM    [facility]

## 2018-12-25 LAB — HEPATIC FUNCTION PANEL
ALT: 18 IU/L (ref 0–44)
AST: 18 IU/L (ref 0–40)
Albumin: 4.6 g/dL (ref 3.8–4.8)
Alkaline Phosphatase: 53 IU/L (ref 39–117)
Bilirubin Total: 0.9 mg/dL (ref 0.0–1.2)
Bilirubin, Direct: 0.21 mg/dL (ref 0.00–0.40)
Total Protein: 6.7 g/dL (ref 6.0–8.5)

## 2018-12-25 LAB — BASIC METABOLIC PANEL
BUN/Creatinine Ratio: 12 (ref 10–24)
BUN: 11 mg/dL (ref 8–27)
CO2: 23 mmol/L (ref 20–29)
Calcium: 9.6 mg/dL (ref 8.6–10.2)
Chloride: 99 mmol/L (ref 96–106)
Creatinine, Ser: 0.92 mg/dL (ref 0.76–1.27)
GFR calc Af Amer: 98 mL/min/{1.73_m2} (ref >59)
GFR calc non Af Amer: 85 mL/min/{1.73_m2} (ref >59)
Glucose: 79 mg/dL (ref 65–99)
Potassium: 4.2 mmol/L (ref 3.5–5.2)
Sodium: 138 mmol/L (ref 134–144)

## 2018-12-25 LAB — LIPID PANEL
Chol/HDL Ratio: 3.4 ratio (ref 0.0–5.0)
Cholesterol, Total: 148 mg/dL (ref 100–199)
HDL: 44 mg/dL (ref >39)
LDL Calculated: 84 mg/dL (ref 0–99)
Triglycerides: 99 mg/dL (ref 0–149)
VLDL Cholesterol Cal: 20 mg/dL (ref 5–40)

## 2018-12-25 LAB — HEMOGLOBIN A1C
Est. average glucose Bld gHb Est-mCnc: 105 mg/dL
Hgb A1c MFr Bld: 5.3 % (ref 4.8–5.6)

## 2018-12-25 LAB — SEDIMENTATION RATE: Sed Rate: 2 mm/hr (ref 0–30)

## 2018-12-25 LAB — CK: Total CK: 166 U/L (ref 41–331)

## 2019-01-15 ENCOUNTER — Other Ambulatory Visit: Payer: Self-pay

## 2019-01-15 ENCOUNTER — Encounter: Payer: Self-pay | Admitting: Family Medicine

## 2019-01-15 ENCOUNTER — Ambulatory Visit (INDEPENDENT_AMBULATORY_CARE_PROVIDER_SITE_OTHER): Payer: Medicare Other | Admitting: Family Medicine

## 2019-01-15 DIAGNOSIS — M546 Pain in thoracic spine: Secondary | ICD-10-CM | POA: Diagnosis not present

## 2019-01-15 DIAGNOSIS — G8929 Other chronic pain: Secondary | ICD-10-CM | POA: Diagnosis not present

## 2019-01-15 MED ORDER — BACLOFEN 10 MG PO TABS: 5.0000 mg | ORAL_TABLET | Freq: Two times a day (BID) | ORAL | 0 refills | Status: DC | PRN

## 2019-01-15 NOTE — Patient Instructions (Signed)
Nice to meet you Please try the exercises  Please try heat on the lower back  Please let me know about physical therapy.  The muscle may make you sleepy  Please send me a message in MyChart with any questions or updates.  Please see me back in 4 weeks.   --Dr. Raeford Razor

## 2019-01-15 NOTE — Assessment & Plan Note (Signed)
Pain seems to be more spasm or myofascial in nature.  He has lack of extension and feels trouble getting to full extension of the back. -Counseled on home exercise therapy and supportive care. -He is deciding if he would like to try physical therapy. -Baclofen. -Could consider trigger point injections or imaging.

## 2019-01-15 NOTE — Progress Notes (Signed)
Kiko Ripp - 68 y.o. male MRN [id number]  Date of birth: Jun 15, 1951  SUBJECTIVE:  Including CC & ROS.  Chief Complaint  Patient presents with  . Back Pain    upper back    Sarkis Rhines is a 68 y.o. male that is presenting with acute on chronic thoracic back pain.  The pain is been occurring for the past 7 8 months.  It is intermittent in nature.  He feels the pain worse at the end of the day.  He feels like he is leaning forward when he is walking.  The pain can catch him when he is transitioning from lying to standing.  He denies any inciting event.  He is working from home.  No prior history of physical therapy.  No prior history of surgery.  Seems to be localized to the back..   Review of Systems  Constitutional: Negative for fever.  HENT: Negative for congestion.   Respiratory: Negative for cough.   Cardiovascular: Negative for chest pain.  Gastrointestinal: Negative for abdominal pain.  Musculoskeletal: Positive for back pain.  Skin: Negative for color change.  Neurological: Negative for weakness.  Hematological: Negative for adenopathy.    HISTORY: Past Medical, Surgical, Social, and Family History Reviewed & Updated per EMR.   Pertinent Historical Findings include:  Past Medical History:  Diagnosis Date  . Myocardial infarction ([place])    non-STEMI 08/2013, CABG x 3 09/25/2013    Past Surgical History:  Procedure Laterality Date  . CORONARY ARTERY BYPASS GRAFT N/A 09/25/2013   Procedure: CORONARY ARTERY BYPASS GRAFTING (CABG);  Surgeon: Melrose Nakayama, MD;  Location: [place];  Service: Open Heart Surgery;  Laterality: N/A;  Times 3 using left internal mammary artery and endoscopically harvested right saphenous vein.  . INTRAOPERATIVE TRANSESOPHAGEAL ECHOCARDIOGRAM N/A 09/25/2013   Procedure: INTRAOPERATIVE TRANSESOPHAGEAL ECHOCARDIOGRAM;  Surgeon: Melrose Nakayama, MD;  Location: [place];  Service: Open Heart Surgery;  Laterality: N/A;  . LEFT HEART CATHETERIZATION  WITH CORONARY ANGIOGRAM N/A 09/25/2013   Procedure: LEFT HEART CATHETERIZATION WITH CORONARY ANGIOGRAM;  Surgeon: Birdie Riddle, MD;  Location: [place] [facility]ATH [facility];  Service: Cardiovascular;  Laterality: N/A;    No Known Allergies  Family History  Problem Relation Age of Onset  . Coronary artery disease Father 20       had a stent  . Heart attack Father   . Heart disease Father      Social History   Socioeconomic History  . Marital status: Married    Spouse name: Not on file  . Number of children: Not on file  . Years of education: Not on file  . Highest education level: Not on file  Occupational History  . Occupation: Sports coach: [employer]  . Financial resource strain: Not on file  . Food insecurity    Worry: Not on file    Inability: Not on file  . Transportation needs    Medical: Not on file    Non-medical: Not on file  Tobacco Use  . Smoking status: Former Research scientist (life sciences)  . Smokeless tobacco: Never Used  Substance and Sexual Activity  . Alcohol use: No    Alcohol/week: 0.0 standard drinks  . Drug use: No  . Sexual activity: Yes  Lifestyle  . Physical activity    Days per week: Not on file    Minutes per session: Not on file  . Stress: Not on file  Relationships  . Social connections  Talks on phone: Not on file    Gets together: Not on file    Attends religious service: Not on file    Active member of club or organization: Not on file    Attends meetings of clubs or organizations: Not on file    Relationship status: Not on file  . Intimate partner violence    Fear of current or ex partner: Not on file    Emotionally abused: Not on file    Physically abused: Not on file    Forced sexual activity: Not on file  Other Topics Concern  . Not on file  Social History Narrative  . Not on file     PHYSICAL EXAM:  VS: BP (!) 144/82   Pulse 65   Ht 5\' 9"  (1.753 m)   Wt 188 lb (85.3 kg)   BMI 27.76 kg/m  Physical Exam Gen: NAD,  alert, cooperative with exam, well-appearing ENT: normal lips, normal nasal mucosa,  Eye: normal EOM, normal conjunctiva and lids CV:  no edema, +2 pedal pulses   Resp: no accessory muscle use, non-labored,   Skin: no rashes, no areas of induration  Neuro: normal tone, normal sensation to touch Psych:  normal insight, alert and oriented MSK:  Back: Normal flexion. Limited extension. Normal strength resistance with hip flexion, plantarflexion and dorsiflexion. No significant tenderness palpation over the paraspinal thoracic muscles. Normal shoulder range of motion. No winging of the scapula. Neurovascular intact     ASSESSMENT & PLAN:   Thoracic back pain Pain seems to be more spasm or myofascial in nature.  He has lack of extension and feels trouble getting to full extension of the back. -Counseled on home exercise therapy and supportive care. -He is deciding if he would like to try physical therapy. -Baclofen. -Could consider trigger point injections or imaging.

## 2019-02-09 ENCOUNTER — Other Ambulatory Visit: Payer: Self-pay | Admitting: Family Medicine

## 2019-02-09 ENCOUNTER — Other Ambulatory Visit: Payer: Self-pay | Admitting: Interventional Cardiology

## 2019-02-11 ENCOUNTER — Other Ambulatory Visit: Payer: Self-pay

## 2019-02-11 DIAGNOSIS — Z20822 Contact with and (suspected) exposure to covid-19: Secondary | ICD-10-CM

## 2019-02-12 ENCOUNTER — Ambulatory Visit: Payer: Medicare Other | Admitting: Family Medicine

## 2019-02-13 LAB — NOVEL CORONAVIRUS, NAA: SARS-CoV-2, NAA: NOT DETECTED

## 2019-03-02 ENCOUNTER — Encounter: Payer: Self-pay | Admitting: Family Medicine

## 2019-03-02 ENCOUNTER — Other Ambulatory Visit: Payer: Self-pay

## 2019-03-02 ENCOUNTER — Telehealth: Payer: Self-pay | Admitting: Family Medicine

## 2019-03-02 ENCOUNTER — Ambulatory Visit (INDEPENDENT_AMBULATORY_CARE_PROVIDER_SITE_OTHER): Payer: Medicare Other | Admitting: Family Medicine

## 2019-03-02 ENCOUNTER — Ambulatory Visit (HOSPITAL_BASED_OUTPATIENT_CLINIC_OR_DEPARTMENT_OTHER)
Admission: RE | Admit: 2019-03-02 | Discharge: 2019-03-02 | Disposition: A | Discharge status: Home / Self Care | Payer: Medicare Other | Source: Ambulatory Visit | Attending: Family Medicine | Admitting: Family Medicine

## 2019-03-02 VITALS — BP 118/80 | Ht 68.0 in | Wt 189.0 lb

## 2019-03-02 DIAGNOSIS — M546 Pain in thoracic spine: Secondary | ICD-10-CM | POA: Diagnosis not present

## 2019-03-02 DIAGNOSIS — G8929 Other chronic pain: Secondary | ICD-10-CM | POA: Diagnosis not present

## 2019-03-02 NOTE — Assessment & Plan Note (Signed)
Having some improvement with home exercises. -Thoracic and lumbar x-rays today. -Counseled on home exercise therapy and supportive care. -Can follow-up in 2 to 3 months.  Could consider physical therapy at that time.

## 2019-03-02 NOTE — Progress Notes (Signed)
Eric Warren - 68 y.o. male MRN [id number]  Date of birth: 10/01/1950  SUBJECTIVE:  Including CC & ROS.  Chief Complaint  Patient presents with  . Follow-up    follow up for back    Can Eric Warren is a 68 y.o. male that is following up for ongoing back pain.  He has been doing the home exercises every so often.  The pain seems to be worse at the end of the day but he also feels fatigued.  He feels like his posture gets worse at the end of the day.  He has some soreness after performing the home exercises.  He does feel like the pain has gotten somewhat better.  He feels the pain in the midthoracic region into the upper lumbar area.  Denies any injury or radicular symptoms.   Review of Systems  Constitutional: Negative for fever.  HENT: Negative for congestion.   Respiratory: Negative for cough.   Cardiovascular: Negative for chest pain.  Gastrointestinal: Negative for abdominal pain.  Musculoskeletal: Positive for back pain.  Neurological: Negative for weakness.  Hematological: Negative for adenopathy.    HISTORY: Past Medical, Surgical, Social, and Family History Reviewed & Updated per EMR.   Pertinent Historical Findings include:  Past Medical History:  Diagnosis Date  . Myocardial infarction ([place])    non-STEMI 08/2013, CABG x 3 09/25/2013    Past Surgical History:  Procedure Laterality Date  . CORONARY ARTERY BYPASS GRAFT N/A 09/25/2013   Procedure: CORONARY ARTERY BYPASS GRAFTING (CABG);  Surgeon: Melrose Nakayama, MD;  Location: [place];  Service: Open Heart Surgery;  Laterality: N/A;  Times 3 using left internal mammary artery and endoscopically harvested right saphenous vein.  . INTRAOPERATIVE TRANSESOPHAGEAL ECHOCARDIOGRAM N/A 09/25/2013   Procedure: INTRAOPERATIVE TRANSESOPHAGEAL ECHOCARDIOGRAM;  Surgeon: Melrose Nakayama, MD;  Location: [place];  Service: Open Heart Surgery;  Laterality: N/A;  . LEFT HEART CATHETERIZATION WITH CORONARY ANGIOGRAM N/A 09/25/2013   Procedure: LEFT HEART CATHETERIZATION WITH CORONARY ANGIOGRAM;  Surgeon: Birdie Riddle, MD;  Location: [place] [facility];  Service: Cardiovascular;  Laterality: N/A;    No Known Allergies  Family History  Problem Relation Age of Onset  . Coronary artery disease Father 23       had a stent  . Heart attack Father   . Heart disease Father      Social History   Socioeconomic History  . Marital status: Married    Spouse name: Not on file  . Number of children: Not on file  . Years of education: Not on file  . Highest education level: Not on file  Occupational History  . Occupation: Sports coach: [employer]  . Financial resource strain: Not on file  . Food insecurity    Worry: Not on file    Inability: Not on file  . Transportation needs    Medical: Not on file    Non-medical: Not on file  Tobacco Use  . Smoking status: Former Research scientist (life sciences)  . Smokeless tobacco: Never Used  Substance and Sexual Activity  . Alcohol use: No    Alcohol/week: 0.0 standard drinks  . Drug use: No  . Sexual activity: Yes  Lifestyle  . Physical activity    Days per week: Not on file    Minutes per session: Not on file  . Stress: Not on file  Relationships  . Social connections    Talks on phone: Not on file  Gets together: Not on file    Attends religious service: Not on file    Active member of club or organization: Not on file    Attends meetings of clubs or organizations: Not on file    Relationship status: Not on file  . Intimate partner violence    Fear of current or ex partner: Not on file    Emotionally abused: Not on file    Physically abused: Not on file    Forced sexual activity: Not on file  Other Topics Concern  . Not on file  Social History Narrative  . Not on file     PHYSICAL EXAM:  VS: BP 118/80   Ht 5\' 8"  (1.727 m)   Wt 189 lb (85.7 kg)   BMI 28.74 kg/m  Physical Exam Gen: NAD, alert, cooperative with exam, well-appearing ENT: normal  lips, normal nasal mucosa,  Eye: normal EOM, normal conjunctiva and lids CV:  no edema, +2 pedal pulses   Resp: no accessory muscle use, non-labored,  Skin: no rashes, no areas of induration  Neuro: normal tone, normal sensation to touch Psych:  normal insight, alert and oriented MSK:  Back: No abnormal winging of the scapula No significant tenderness to palpation of the midline thoracic or lumbar spine. Normal flexion extension. Normal strength in lower extremity. Normal gait. Neurovascular intact     ASSESSMENT & PLAN:   Thoracic back pain Having some improvement with home exercises. -Thoracic and lumbar x-rays today. -Counseled on home exercise therapy and supportive care. -Can follow-up in 2 to 3 months.  Could consider physical therapy at that time.

## 2019-03-02 NOTE — Patient Instructions (Signed)
Good to see you Please continue the exercises  Please focus on your posture.  Please try heat  I will call you with the results from today   Please send me a message in MyChart with any questions or updates.  Please see me back in 2-3 months.   --Dr. Raeford Razor

## 2019-03-02 NOTE — Telephone Encounter (Signed)
Left VM for patient. If he calls back please have him speak with a nurse/CMA and inform that his thoracic xray shows a slight curve forward and his lower back xray shows an area at L4--L5 and L5-S1 that is degenerative and we call it a spondylosis. This happens with facet arthritis. Exercises can continue to help.   If any questions then please take the best time and phone number to call and I will try to call him back.   Rosemarie Ax, MD [facility] 03/02/2019, 5:16 PM

## 2019-05-01 ENCOUNTER — Ambulatory Visit: Payer: Medicare Other | Admitting: Family Medicine

## 2019-05-11 ENCOUNTER — Other Ambulatory Visit: Payer: Self-pay | Admitting: Interventional Cardiology

## 2019-11-03 ENCOUNTER — Other Ambulatory Visit: Payer: Self-pay | Admitting: Interventional Cardiology

## 2020-02-01 ENCOUNTER — Other Ambulatory Visit: Payer: Self-pay | Admitting: Interventional Cardiology

## 2020-02-03 ENCOUNTER — Other Ambulatory Visit: Payer: Self-pay

## 2020-02-03 MED ORDER — ROSUVASTATIN CALCIUM 40 MG PO TABS: 40.0000 mg | ORAL_TABLET | Freq: Every day | ORAL | 0 refills | Status: DC

## 2020-02-03 MED ORDER — LISINOPRIL 5 MG PO TABS: ORAL_TABLET | ORAL | 0 refills | Status: DC

## 2020-02-03 MED ORDER — METOPROLOL SUCCINATE ER 100 MG PO TB24: ORAL_TABLET | ORAL | 0 refills | Status: DC

## 2020-02-17 NOTE — Progress Notes (Signed)
Cardiology Office Note:    Date:  02/18/2020   ID:  Eric Warren, DOB 01-Jan-1951, MRN [id number]  PCP:  Maury Dus, MD  Cardiologist:  No primary care provider on file.   Referring MD: Maury Dus, MD   No chief complaint on file.   History of Present Illness:    Eric Warren is a 69 y.o. male with a hx of  Coronary artery disease, non-ST elevation myocardial infarction March 2015 and multivessel coronary bypass grafting March 2015 (LIMA to LAD, seq SVG to Diag and OM), hyperlipidemia, and prior smoker  He is doing relatively well.  Has not been as physically active as he did previously pandemic.  He does notice some shortness of breath when he walks.  He is not having chest pain.  He denies orthopnea, lower extremity swelling, and PND.  Past Medical History:  Diagnosis Date  . Myocardial infarction ([place])    non-STEMI 08/2013, CABG x 3 09/25/2013    Past Surgical History:  Procedure Laterality Date  . CORONARY ARTERY BYPASS GRAFT N/A 09/25/2013   Procedure: CORONARY ARTERY BYPASS GRAFTING (CABG);  Surgeon: Melrose Nakayama, MD;  Location: [place];  Service: Open Heart Surgery;  Laterality: N/A;  Times 3 using left internal mammary artery and endoscopically harvested right saphenous vein.  . INTRAOPERATIVE TRANSESOPHAGEAL ECHOCARDIOGRAM N/A 09/25/2013   Procedure: INTRAOPERATIVE TRANSESOPHAGEAL ECHOCARDIOGRAM;  Surgeon: Melrose Nakayama, MD;  Location: [place];  Service: Open Heart Surgery;  Laterality: N/A;  . LEFT HEART CATHETERIZATION WITH CORONARY ANGIOGRAM N/A 09/25/2013   Procedure: LEFT HEART CATHETERIZATION WITH CORONARY ANGIOGRAM;  Surgeon: Birdie Riddle, MD;  Location: [place] [facility]ATH [facility];  Service: Cardiovascular;  Laterality: N/A;    Current Medications: Current Meds  Medication Sig  . aspirin EC 81 MG tablet Take 1 tablet (81 mg total) by mouth daily.  . beta carotene w/minerals (OCUVITE) tablet Take 1 tablet by mouth daily.  Marland Kitchen lisinopril (ZESTRIL) 5 MG tablet  TAKE 1 TABLET(5 MG) BY MOUTH DAILY. Please keep upcoming appt in August with Dr. Tamala Julian before anymore refills. Thank you  . metoprolol succinate (TOPROL-XL) 100 MG 24 hr tablet TAKE 1 TABLET(100 MG) BY MOUTH DAILY. Please keep upcoming appt in August with Dr. Tamala Julian before anymore refills. Thank you  . rosuvastatin (CRESTOR) 40 MG tablet Take 1 tablet (40 mg total) by mouth daily. Please keep upcoming appt in August with Dr. Tamala Julian before anymore refills. Thank you     Allergies:   Patient has no known allergies.   Social History   Socioeconomic History  . Marital status: Married    Spouse name: Not on file  . Number of children: Not on file  . Years of education: Not on file  . Highest education level: Not on file  Occupational History  . Occupation: real estate    Employer: [employer]  Tobacco Use  . Smoking status: Former Research scientist (life sciences)  . Smokeless tobacco: Never Used  Substance and Sexual Activity  . Alcohol use: No    Alcohol/week: 0.0 standard drinks  . Drug use: No  . Sexual activity: Yes  Other Topics Concern  . Not on file  Social History Narrative  . Not on file   Social Determinants of Health   Financial Resource Strain:   . Difficulty of Paying Living Expenses: Not on file  Food Insecurity:   . Worried About Charity fundraiser in the Last Year: Not on file  . Ran Out of Food in the Last  Year: Not on file  Transportation Needs:   . Lack of Transportation (Medical): Not on file  . Lack of Transportation (Non-Medical): Not on file  Physical Activity:   . Days of Exercise per Week: Not on file  . Minutes of Exercise per Session: Not on file  Stress:   . Feeling of Stress : Not on file  Social Connections:   . Frequency of Communication with Friends and Family: Not on file  . Frequency of Social Gatherings with Friends and Family: Not on file  . Attends Religious Services: Not on file  . Active Member of Clubs or Organizations: Not on file  . Attends Theatre manager Meetings: Not on file  . Marital Status: Not on file     Family History: The patient's family history includes Coronary artery disease (age of onset: 37) in his father; Heart attack in his father; Heart disease in his father.  ROS:   Please see the history of present illness.    He is concerned about developing a hunched over posture.  He otherwise has no particular complaints.  All other systems reviewed and are negative.  EKGs/Labs/Other Studies Reviewed:    The following studies were reviewed today: No new imaging data.  EKG:  EKG sinus bradycardia, 56 bpm, QS pattern V1 and V2.  Compared to the prior tracing performed in June 2020, no changes noted.  Recent Labs: No results found for requested labs within last 8760 hours.  Recent Lipid Panel    Component Value Date/Time   CHOL 148 12/24/2018 1601   TRIG 99 12/24/2018 1601   HDL 44 12/24/2018 1601   CHOLHDL 3.4 12/24/2018 1601   CHOLHDL 4.3 11/10/2015 0812   VLDL 15 11/10/2015 0812   LDLCALC 84 12/24/2018 1601    Physical Exam:    VS:  BP (!) 148/86   Pulse (!) 56   Ht 5\' 8"  (1.727 m)   Wt 187 lb 6.4 oz (85 kg)   SpO2 97%   BMI 28.49 kg/m     Wt Readings from Last 3 Encounters:  02/18/20 187 lb 6.4 oz (85 kg)  03/02/19 189 lb (85.7 kg)  01/15/19 188 lb (85.3 kg)     GEN: Overweight. No acute distress HEENT: Normal NECK: No JVD. LYMPHATICS: No lymphadenopathy CARDIAC:  RRR without murmur, gallop, or edema. VASCULAR:  Normal Pulses. No bruits. RESPIRATORY:  Clear to auscultation without rales, wheezing or rhonchi  ABDOMEN: Soft, non-tender, non-distended, No pulsatile mass, MUSCULOSKELETAL: No deformity  SKIN: Warm and dry NEUROLOGIC:  Alert and oriented x 3 PSYCHIATRIC:  Normal affect   ASSESSMENT:    1. Coronary artery disease involving coronary bypass graft of native heart with angina pectoris ([place])   2. Essential hypertension   3. Other hyperlipidemia   4. Educated about COVID-19  virus infection    PLAN:    In order of problems listed above:  1. Secondary prevention discussed in detail.  He needs to increase moderate physical activity to achieve greater than 150 minutes of activity per week. 2. Blood pressure control is not optimal.  Decrease salt in diet, lose weight, increase physical activity.  Continue Toprol-XL, Zestril, and monitor at home. 3. Continue Crestor 40 mg/day.  Last blood work was done by Dr. Mariea Clonts in December LDL was 75. 4. He has been vaccinated and is practicing social distancing.  Overall education and awareness concerning primary/secondary risk prevention was discussed in detail: LDL less than 70, hemoglobin A1c less than 7,  blood pressure target less than 130/80 mmHg, >150 minutes of moderate aerobic activity per week, avoidance of smoking, weight control (via diet and exercise), and continued surveillance/management of/for obstructive sleep apnea.    Medication Adjustments/Labs and Tests Ordered: Current medicines are reviewed at length with the patient today.  Concerns regarding medicines are outlined above.  Orders Placed This Encounter  Procedures  . EKG 12-Lead   No orders of the defined types were placed in this encounter.   There are no Patient Instructions on file for this visit.   Signed, Sinclair Grooms, MD  02/18/2020 9:53 AM    [place]

## 2020-02-18 ENCOUNTER — Encounter: Payer: Self-pay | Admitting: Interventional Cardiology

## 2020-02-18 ENCOUNTER — Other Ambulatory Visit: Payer: Self-pay

## 2020-02-18 ENCOUNTER — Ambulatory Visit: Payer: Medicare Other | Admitting: Interventional Cardiology

## 2020-02-18 VITALS — BP 148/86 | HR 56 | Ht 68.0 in | Wt 187.4 lb

## 2020-02-18 DIAGNOSIS — I25709 Atherosclerosis of coronary artery bypass graft(s), unspecified, with unspecified angina pectoris: Secondary | ICD-10-CM

## 2020-02-18 DIAGNOSIS — I10 Essential (primary) hypertension: Secondary | ICD-10-CM | POA: Diagnosis not present

## 2020-02-18 DIAGNOSIS — Z7189 Other specified counseling: Secondary | ICD-10-CM

## 2020-02-18 DIAGNOSIS — E7849 Other hyperlipidemia: Secondary | ICD-10-CM | POA: Diagnosis not present

## 2020-02-18 NOTE — Patient Instructions (Signed)
Medication Instructions:  Your physician recommends that you continue on your current medications as directed. Please refer to the Current Medication list given to you today.  *If you need a refill on your cardiac medications before your next appointment, please call your pharmacy*   Lab Work: None If you have labs (blood work) drawn today and your tests are completely normal, you will receive your results only by: Marland Kitchen MyChart Message (if you have MyChart) OR . A paper copy in the mail If you have any lab test that is abnormal or we need to change your treatment, we will call you to review the results.   Testing/Procedures: None   Follow-Up: At [facility], you and your health needs are our priority.  As part of our continuing mission to provide you with exceptional heart care, we have created designated Provider Care Teams.  These Care Teams include your primary Cardiologist (physician) and Advanced Practice Providers (APPs -  Physician Assistants and Nurse Practitioners) who all work together to provide you with the care you need, when you need it.  We recommend signing up for the patient portal called "MyChart".  Sign up information is provided on this After Visit Summary.  MyChart is used to connect with patients for Virtual Visits (Telemedicine).  Patients are able to view lab/test results, encounter notes, upcoming appointments, etc.  Non-urgent messages can be sent to your provider as well.   To learn more about what you can do with MyChart, go to [email or web address].    Your next appointment:   12 month(s)  The format for your next appointment:   In Person  Provider:   You may see Dr. Daneen Schick or one of the following Advanced Practice Providers on your designated Care Team:    Truitt Merle, NP  Cecilie Kicks, NP  Kathyrn Drown, NP    Other Instructions  Your provider recommends that you achieve greater than 150 minutes per week of moderate aerobic  activity.

## 2020-05-01 ENCOUNTER — Other Ambulatory Visit: Payer: Self-pay | Admitting: Interventional Cardiology

## 2020-07-30 ENCOUNTER — Other Ambulatory Visit: Payer: Self-pay | Admitting: Interventional Cardiology

## 2021-03-21 ENCOUNTER — Telehealth: Payer: Self-pay | Admitting: Interventional Cardiology

## 2021-03-21 DIAGNOSIS — H353211 Exudative age-related macular degeneration, right eye, with active choroidal neovascularization: Secondary | ICD-10-CM | POA: Diagnosis not present

## 2021-03-21 DIAGNOSIS — H43821 Vitreomacular adhesion, right eye: Secondary | ICD-10-CM | POA: Diagnosis not present

## 2021-03-21 DIAGNOSIS — H353122 Nonexudative age-related macular degeneration, left eye, intermediate dry stage: Secondary | ICD-10-CM | POA: Diagnosis not present

## 2021-03-21 DIAGNOSIS — Z961 Presence of intraocular lens: Secondary | ICD-10-CM | POA: Diagnosis not present

## 2021-03-21 DIAGNOSIS — H5202 Hypermetropia, left eye: Secondary | ICD-10-CM | POA: Diagnosis not present

## 2021-03-21 DIAGNOSIS — H524 Presbyopia: Secondary | ICD-10-CM | POA: Diagnosis not present

## 2021-03-21 DIAGNOSIS — H52223 Regular astigmatism, bilateral: Secondary | ICD-10-CM | POA: Diagnosis not present

## 2021-03-21 NOTE — Telephone Encounter (Signed)
Spoke with Larene Beach and made her aware that pt does not require antibiotics prior to dental work.  Larene Beach appreciative for call.

## 2021-03-21 NOTE — Telephone Encounter (Signed)
Eric Warren from [place] is reaching out to see if pt is needing any pre meds before having dental work done... pt is getting a deep cleaning

## 2021-04-05 DIAGNOSIS — H353122 Nonexudative age-related macular degeneration, left eye, intermediate dry stage: Secondary | ICD-10-CM | POA: Diagnosis not present

## 2021-04-05 DIAGNOSIS — H353211 Exudative age-related macular degeneration, right eye, with active choroidal neovascularization: Secondary | ICD-10-CM | POA: Diagnosis not present

## 2021-04-17 DIAGNOSIS — M5384 Other specified dorsopathies, thoracic region: Secondary | ICD-10-CM | POA: Diagnosis not present

## 2021-04-17 DIAGNOSIS — M9902 Segmental and somatic dysfunction of thoracic region: Secondary | ICD-10-CM | POA: Diagnosis not present

## 2021-04-17 DIAGNOSIS — M25551 Pain in right hip: Secondary | ICD-10-CM | POA: Diagnosis not present

## 2021-04-17 DIAGNOSIS — M9905 Segmental and somatic dysfunction of pelvic region: Secondary | ICD-10-CM | POA: Diagnosis not present

## 2021-04-18 DIAGNOSIS — M25551 Pain in right hip: Secondary | ICD-10-CM | POA: Diagnosis not present

## 2021-04-18 DIAGNOSIS — M9905 Segmental and somatic dysfunction of pelvic region: Secondary | ICD-10-CM | POA: Diagnosis not present

## 2021-04-18 DIAGNOSIS — M5384 Other specified dorsopathies, thoracic region: Secondary | ICD-10-CM | POA: Diagnosis not present

## 2021-04-18 DIAGNOSIS — M9902 Segmental and somatic dysfunction of thoracic region: Secondary | ICD-10-CM | POA: Diagnosis not present

## 2021-04-20 DIAGNOSIS — M9905 Segmental and somatic dysfunction of pelvic region: Secondary | ICD-10-CM | POA: Diagnosis not present

## 2021-04-20 DIAGNOSIS — M25551 Pain in right hip: Secondary | ICD-10-CM | POA: Diagnosis not present

## 2021-04-20 DIAGNOSIS — M9902 Segmental and somatic dysfunction of thoracic region: Secondary | ICD-10-CM | POA: Diagnosis not present

## 2021-04-20 DIAGNOSIS — M5384 Other specified dorsopathies, thoracic region: Secondary | ICD-10-CM | POA: Diagnosis not present

## 2021-04-24 DIAGNOSIS — M9905 Segmental and somatic dysfunction of pelvic region: Secondary | ICD-10-CM | POA: Diagnosis not present

## 2021-04-24 DIAGNOSIS — M25551 Pain in right hip: Secondary | ICD-10-CM | POA: Diagnosis not present

## 2021-04-24 DIAGNOSIS — M5384 Other specified dorsopathies, thoracic region: Secondary | ICD-10-CM | POA: Diagnosis not present

## 2021-04-24 DIAGNOSIS — M9902 Segmental and somatic dysfunction of thoracic region: Secondary | ICD-10-CM | POA: Diagnosis not present

## 2021-04-25 DIAGNOSIS — H353211 Exudative age-related macular degeneration, right eye, with active choroidal neovascularization: Secondary | ICD-10-CM | POA: Diagnosis not present

## 2021-04-25 DIAGNOSIS — H353122 Nonexudative age-related macular degeneration, left eye, intermediate dry stage: Secondary | ICD-10-CM | POA: Diagnosis not present

## 2021-04-25 DIAGNOSIS — H3581 Retinal edema: Secondary | ICD-10-CM | POA: Diagnosis not present

## 2021-04-26 DIAGNOSIS — M9905 Segmental and somatic dysfunction of pelvic region: Secondary | ICD-10-CM | POA: Diagnosis not present

## 2021-04-26 DIAGNOSIS — M5384 Other specified dorsopathies, thoracic region: Secondary | ICD-10-CM | POA: Diagnosis not present

## 2021-04-26 DIAGNOSIS — M9902 Segmental and somatic dysfunction of thoracic region: Secondary | ICD-10-CM | POA: Diagnosis not present

## 2021-04-26 DIAGNOSIS — M25551 Pain in right hip: Secondary | ICD-10-CM | POA: Diagnosis not present

## 2021-04-27 DIAGNOSIS — M5384 Other specified dorsopathies, thoracic region: Secondary | ICD-10-CM | POA: Diagnosis not present

## 2021-04-27 DIAGNOSIS — M9905 Segmental and somatic dysfunction of pelvic region: Secondary | ICD-10-CM | POA: Diagnosis not present

## 2021-04-27 DIAGNOSIS — M25551 Pain in right hip: Secondary | ICD-10-CM | POA: Diagnosis not present

## 2021-04-27 DIAGNOSIS — M9902 Segmental and somatic dysfunction of thoracic region: Secondary | ICD-10-CM | POA: Diagnosis not present

## 2021-05-02 DIAGNOSIS — M25551 Pain in right hip: Secondary | ICD-10-CM | POA: Diagnosis not present

## 2021-05-02 DIAGNOSIS — M9905 Segmental and somatic dysfunction of pelvic region: Secondary | ICD-10-CM | POA: Diagnosis not present

## 2021-05-02 DIAGNOSIS — M5384 Other specified dorsopathies, thoracic region: Secondary | ICD-10-CM | POA: Diagnosis not present

## 2021-05-02 DIAGNOSIS — M9902 Segmental and somatic dysfunction of thoracic region: Secondary | ICD-10-CM | POA: Diagnosis not present

## 2021-05-03 ENCOUNTER — Other Ambulatory Visit: Payer: Self-pay | Admitting: Interventional Cardiology

## 2021-05-04 ENCOUNTER — Other Ambulatory Visit: Payer: Self-pay

## 2021-05-04 DIAGNOSIS — M9902 Segmental and somatic dysfunction of thoracic region: Secondary | ICD-10-CM | POA: Diagnosis not present

## 2021-05-04 DIAGNOSIS — M5384 Other specified dorsopathies, thoracic region: Secondary | ICD-10-CM | POA: Diagnosis not present

## 2021-05-04 DIAGNOSIS — M25551 Pain in right hip: Secondary | ICD-10-CM | POA: Diagnosis not present

## 2021-05-04 DIAGNOSIS — M9905 Segmental and somatic dysfunction of pelvic region: Secondary | ICD-10-CM | POA: Diagnosis not present

## 2021-05-04 MED ORDER — ROSUVASTATIN CALCIUM 40 MG PO TABS: 40.0000 mg | ORAL_TABLET | Freq: Every day | ORAL | 0 refills | Status: DC

## 2021-05-04 MED ORDER — METOPROLOL SUCCINATE ER 100 MG PO TB24: 100.0000 mg | ORAL_TABLET | Freq: Every day | ORAL | 0 refills | Status: DC

## 2021-05-04 MED ORDER — LISINOPRIL 5 MG PO TABS: 5.0000 mg | ORAL_TABLET | Freq: Every day | ORAL | 0 refills | Status: DC

## 2021-05-08 DIAGNOSIS — U071 COVID-19: Secondary | ICD-10-CM | POA: Diagnosis not present

## 2021-05-09 ENCOUNTER — Ambulatory Visit: Payer: Medicare Other | Admitting: Interventional Cardiology

## 2021-05-26 ENCOUNTER — Other Ambulatory Visit: Payer: Self-pay

## 2021-05-26 ENCOUNTER — Emergency Department (HOSPITAL_BASED_OUTPATIENT_CLINIC_OR_DEPARTMENT_OTHER)
Admission: EM | Admit: 2021-05-26 | Discharge: 2021-05-26 | Disposition: A | Discharge status: Home / Self Care | Payer: Medicare Other | Attending: Emergency Medicine | Admitting: Emergency Medicine

## 2021-05-26 ENCOUNTER — Encounter (HOSPITAL_BASED_OUTPATIENT_CLINIC_OR_DEPARTMENT_OTHER): Payer: Self-pay

## 2021-05-26 DIAGNOSIS — I10 Essential (primary) hypertension: Secondary | ICD-10-CM | POA: Insufficient documentation

## 2021-05-26 DIAGNOSIS — U071 COVID-19: Secondary | ICD-10-CM | POA: Insufficient documentation

## 2021-05-26 DIAGNOSIS — Z87891 Personal history of nicotine dependence: Secondary | ICD-10-CM | POA: Insufficient documentation

## 2021-05-26 DIAGNOSIS — I2510 Atherosclerotic heart disease of native coronary artery without angina pectoris: Secondary | ICD-10-CM | POA: Diagnosis not present

## 2021-05-26 DIAGNOSIS — R0981 Nasal congestion: Secondary | ICD-10-CM | POA: Diagnosis not present

## 2021-05-26 DIAGNOSIS — Z951 Presence of aortocoronary bypass graft: Secondary | ICD-10-CM | POA: Diagnosis not present

## 2021-05-26 DIAGNOSIS — Z79899 Other long term (current) drug therapy: Secondary | ICD-10-CM | POA: Diagnosis not present

## 2021-05-26 DIAGNOSIS — Z7982 Long term (current) use of aspirin: Secondary | ICD-10-CM | POA: Insufficient documentation

## 2021-05-26 LAB — RESP PANEL BY RT-PCR (FLU A&B, COVID) ARPGX2
Influenza A by PCR: NEGATIVE
Influenza B by PCR: NEGATIVE
SARS Coronavirus 2 by RT PCR: POSITIVE — AB

## 2021-05-26 MED ORDER — AMOXICILLIN 500 MG PO CAPS: 500.0000 mg | ORAL_CAPSULE | Freq: Three times a day (TID) | ORAL | 0 refills | Status: AC

## 2021-05-26 NOTE — ED Provider Notes (Signed)
[facility]MENT Provider Note   CSN: [id number] Arrival date & time: 05/26/21  0907     History Chief Complaint  Patient presents with   Facial Pain    [facility] is a 70 y.o. male.  70 y.o male with a PMH of CAD, lipidemia, HTN presents to the ED with a chief complaint of right-sided sinus pressure for the past 5 days.  She reports being ill with a COVID-19 infection earlier in the month, he was prescribed Paxil for which he took completed the course.  Reports he felt better after completing medication, however then it rebounded with the same symptoms.  Wife at the bedside reports he tested positive after completing his medication.  On today's visit he is endorsing right-sided facial pain that is been ongoing for the past 3 days.  Describes the pain exacerbates as the days go on.  Describing this pain as a "head congestion".  He is followed by Dr. Mariea Clonts of [facility]. He has tried [facility] powder with improvement in symptoms, Flonase however feels that his symptoms are worsening.  He denies any chest pain, fever, chest congestion or shortness of breath.    The history is provided by the patient and medical records.      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   INTRAOPERATIVE TRANSESOPHAGEAL ECHOCARDIOGRAM N/A 09/25/2013   Procedure: INTRAOPERATIVE TRANSESOPHAGEAL ECHOCARDIOGRAM;  Surgeon: Melrose Nakayama, MD;  Location: [place];  Service: Open Heart Surgery;  Laterality: N/A;   LEFT HEART CATHETERIZATION WITH CORONARY ANGIOGRAM N/A 09/25/2013   Procedure: LEFT HEART CATHETERIZATION WITH CORONARY ANGIOGRAM;  Surgeon: Birdie Riddle, MD;  Location: [place] CATH LAB;  Service: Cardiovascular;  Laterality: N/A;       Family History  Problem Relation Age of Onset   Coronary artery disease Father 21       had a stent   Heart attack Father    Heart disease Father     Social History   Tobacco Use   Smoking status: Former   Smokeless tobacco: Never  Substance Use Topics   Alcohol use: No    Alcohol/week: 0.0 standard drinks   Drug use: No    Home Medications Prior to Admission medications   Medication Sig Start Date End Date Taking? Authorizing Provider  amoxicillin (AMOXIL) 500 MG capsule Take 1 capsule (500 mg total) by mouth 3 (three) times daily for 7 days. 05/26/21 06/02/21 Yes Janeece Fitting, PA-C  aspirin EC 81 MG tablet Take 1 tablet (81 mg total) by mouth daily. 11/08/16   Belva Crome, MD  beta carotene w/minerals (OCUVITE) tablet Take 1 tablet by mouth daily.    [provider]  lisinopril (ZESTRIL) 5  MG tablet Take 1 tablet (5 mg total) by mouth daily. Please keep upcoming appt. This month in  order receive future refills. 05/04/21   Belva Crome, MD  metoprolol succinate (TOPROL-XL) 100 MG 24 hr tablet Take 1 tablet (100 mg total) by mouth daily. Take with or immediately following a meal. 05/04/21   Belva Crome, MD  rosuvastatin (CRESTOR) 40 MG tablet Take 1 tablet (40 mg total) by mouth daily. Please keep upcoming appt. This month in order to receive future refills. 05/04/21   Belva Crome, MD    Allergies    Patient has  no known allergies.  Review of Systems   Review of Systems  Constitutional:  Negative for chills and fever.  HENT:  Positive for sinus pressure and sinus pain.   Eyes:  Positive for pain.  Respiratory:  Negative for shortness of breath.   Cardiovascular:  Negative for chest pain.   Physical Exam Updated Vital Signs BP (!) 162/97 (BP Location: Right Arm)   Pulse 60   Temp 98.4 F (36.9 C) (Oral)   Resp 18   Ht 5\' 8"  (1.727 m)   Wt 83.9 kg   SpO2 98%   BMI 28.13 kg/m   Physical Exam Vitals and nursing note reviewed.  Constitutional:      Appearance: Normal appearance.  HENT:     Head: Normocephalic and atraumatic.     Comments: No visible rashes or bruising noted.     Right Ear: Tympanic membrane is not injected, erythematous or bulging.     Left Ear: Tympanic membrane is not injected, erythematous or bulging.     Nose: Congestion present. No nasal tenderness.     Right Turbinates: Swollen.     Right Sinus: Maxillary sinus tenderness and frontal sinus tenderness present.     Left Sinus: No frontal sinus tenderness.     Mouth/Throat:     Mouth: Mucous membranes are moist.  Eyes:     Conjunctiva/sclera:     Right eye: Right conjunctiva is injected.     Pupils: Pupils are equal, round, and reactive to light.  Cardiovascular:     Rate and Rhythm: Normal rate.  Pulmonary:     Effort: Pulmonary effort is normal.     Breath sounds: No wheezing or rhonchi.  Abdominal:     General: Abdomen is flat.  Musculoskeletal:     Cervical back: Normal range of motion and neck supple.  Skin:    General: Skin is warm and dry.  Neurological:     Mental Status: He is alert and oriented to person, place, and time.    ED Results / Procedures / Treatments   Labs (all labs ordered are listed, but only abnormal results are displayed) Labs Reviewed  RESP PANEL BY RT-PCR (FLU A&B, COVID) [id number]    EKG None  Radiology No results found.  Procedures Procedures   Medications  Ordered in ED Medications - No data to display  ED Course  I have reviewed the triage vital signs and the nursing notes.  Pertinent labs & imaging results that were available during my care of the patient were reviewed by me and considered in my medical decision making (see chart for details).    MDM Rules/Calculators/A&P  Presents to the ED with a chief complaint of right-sided sinus pressure for the past 3 days.  Patient is currently post COVID-19 infection, reports taking Paxil with improvement in his symptoms, little rebounded.  Feels like his chest congestion,  symptoms have improved aside from right-sided sinus pressure.  He reports worsening symptoms throughout the day, has had no relief with over-the-counter medication.  Derm evaluation right eye appears injected, teary-eyed on evaluation, there is pain along the frontal and maxillary sinuses on the right side.  Left is fair.  Bilateral ears are without any erythema or bulging noted.  Oropharynx is clear of any tonsillar exudate or PTA.  There is some cervical adenopathy noted to the right side.  No visible nuclear rash noted to suggest any shingles.  No bruising, denies any trauma to suggest injury.  He is currently not obtaining any relieve from medications seeing as patients has had symptoms for over 2 weeks after his URI infection, I feel that is reasonable to start him on an empiric course of amoxicillin.  Discussed risks of placing him on this therapy.  Wife is also advised if symptoms change or worsen will need recheck with PCP. Patient does have an appt with his PCP at [facility] Dr. Mariea Clonts on Monday morning. Vitals are otherwise within normal limits. Patient stable for discharge.   Portions of this note were generated with [employer]. Dictation errors may occur despite best attempts at proofreading.  Final Clinical Impression(s) / ED Diagnoses Final diagnoses:  Nasal sinus congestion    Rx / DC Orders ED Discharge  Orders          Ordered    amoxicillin (AMOXIL) 500 MG capsule  3 times daily        05/26/21 1038             Janeece Fitting, [place] 05/26/21 1046    Lucrezia Starch, MD 05/26/21 [phone]

## 2021-05-26 NOTE — Discharge Instructions (Addendum)
I have prescribed antibiotics to help treat your likely sinus infection.  Please take 1 tablet 3 times a day for the next 7 days.  If you experience any changes in your symptoms, worsening symptoms.  Please return to the emergency department

## 2021-05-26 NOTE — ED Triage Notes (Signed)
Pt c/o right facial pain near sinuses and under eye. Has headache, denies fever. Been taking flonase

## 2021-05-26 NOTE — ED Notes (Signed)
Pt reports covid first week of November. Took covid med for 5 days. Tested negative for the last 5 days. Reports pressure right cheek and right eye.

## 2021-05-29 DIAGNOSIS — Z1389 Encounter for screening for other disorder: Secondary | ICD-10-CM | POA: Diagnosis not present

## 2021-05-29 DIAGNOSIS — Z85820 Personal history of malignant melanoma of skin: Secondary | ICD-10-CM | POA: Diagnosis not present

## 2021-05-29 DIAGNOSIS — I25119 Atherosclerotic heart disease of native coronary artery with unspecified angina pectoris: Secondary | ICD-10-CM | POA: Diagnosis not present

## 2021-05-29 DIAGNOSIS — Z0000 Encounter for general adult medical examination without abnormal findings: Secondary | ICD-10-CM | POA: Diagnosis not present

## 2021-05-29 DIAGNOSIS — E7800 Pure hypercholesterolemia, unspecified: Secondary | ICD-10-CM | POA: Diagnosis not present

## 2021-05-29 DIAGNOSIS — I10 Essential (primary) hypertension: Secondary | ICD-10-CM | POA: Diagnosis not present

## 2021-05-31 DIAGNOSIS — M25551 Pain in right hip: Secondary | ICD-10-CM | POA: Diagnosis not present

## 2021-05-31 DIAGNOSIS — M5384 Other specified dorsopathies, thoracic region: Secondary | ICD-10-CM | POA: Diagnosis not present

## 2021-05-31 DIAGNOSIS — M9902 Segmental and somatic dysfunction of thoracic region: Secondary | ICD-10-CM | POA: Diagnosis not present

## 2021-05-31 DIAGNOSIS — M9905 Segmental and somatic dysfunction of pelvic region: Secondary | ICD-10-CM | POA: Diagnosis not present

## 2021-06-01 ENCOUNTER — Encounter: Payer: Self-pay | Admitting: Interventional Cardiology

## 2021-06-01 DIAGNOSIS — M5384 Other specified dorsopathies, thoracic region: Secondary | ICD-10-CM | POA: Diagnosis not present

## 2021-06-01 DIAGNOSIS — M9905 Segmental and somatic dysfunction of pelvic region: Secondary | ICD-10-CM | POA: Diagnosis not present

## 2021-06-01 DIAGNOSIS — M25551 Pain in right hip: Secondary | ICD-10-CM | POA: Diagnosis not present

## 2021-06-01 DIAGNOSIS — M9902 Segmental and somatic dysfunction of thoracic region: Secondary | ICD-10-CM | POA: Diagnosis not present

## 2021-06-05 DIAGNOSIS — M9905 Segmental and somatic dysfunction of pelvic region: Secondary | ICD-10-CM | POA: Diagnosis not present

## 2021-06-05 DIAGNOSIS — M9902 Segmental and somatic dysfunction of thoracic region: Secondary | ICD-10-CM | POA: Diagnosis not present

## 2021-06-05 DIAGNOSIS — M25551 Pain in right hip: Secondary | ICD-10-CM | POA: Diagnosis not present

## 2021-06-05 DIAGNOSIS — M5384 Other specified dorsopathies, thoracic region: Secondary | ICD-10-CM | POA: Diagnosis not present

## 2021-06-12 DIAGNOSIS — M9902 Segmental and somatic dysfunction of thoracic region: Secondary | ICD-10-CM | POA: Diagnosis not present

## 2021-06-12 DIAGNOSIS — M9905 Segmental and somatic dysfunction of pelvic region: Secondary | ICD-10-CM | POA: Diagnosis not present

## 2021-06-12 DIAGNOSIS — M5384 Other specified dorsopathies, thoracic region: Secondary | ICD-10-CM | POA: Diagnosis not present

## 2021-06-12 DIAGNOSIS — M25551 Pain in right hip: Secondary | ICD-10-CM | POA: Diagnosis not present

## 2021-06-13 DIAGNOSIS — D120 Benign neoplasm of cecum: Secondary | ICD-10-CM | POA: Diagnosis not present

## 2021-06-13 DIAGNOSIS — Z86010 Personal history of colonic polyps: Secondary | ICD-10-CM | POA: Diagnosis not present

## 2021-06-13 DIAGNOSIS — D123 Benign neoplasm of transverse colon: Secondary | ICD-10-CM | POA: Diagnosis not present

## 2021-06-19 DIAGNOSIS — D120 Benign neoplasm of cecum: Secondary | ICD-10-CM | POA: Diagnosis not present

## 2021-06-19 DIAGNOSIS — D123 Benign neoplasm of transverse colon: Secondary | ICD-10-CM | POA: Diagnosis not present

## 2021-07-04 DIAGNOSIS — M9902 Segmental and somatic dysfunction of thoracic region: Secondary | ICD-10-CM | POA: Diagnosis not present

## 2021-07-04 DIAGNOSIS — M9905 Segmental and somatic dysfunction of pelvic region: Secondary | ICD-10-CM | POA: Diagnosis not present

## 2021-07-04 DIAGNOSIS — M5384 Other specified dorsopathies, thoracic region: Secondary | ICD-10-CM | POA: Diagnosis not present

## 2021-07-04 DIAGNOSIS — M25551 Pain in right hip: Secondary | ICD-10-CM | POA: Diagnosis not present

## 2021-07-26 DIAGNOSIS — M546 Pain in thoracic spine: Secondary | ICD-10-CM | POA: Diagnosis not present

## 2021-07-27 ENCOUNTER — Other Ambulatory Visit: Payer: Self-pay | Admitting: Sports Medicine

## 2021-07-27 ENCOUNTER — Ambulatory Visit
Admission: RE | Admit: 2021-07-27 | Discharge: 2021-07-27 | Disposition: A | Discharge status: Home / Self Care | Payer: Medicare Other | Source: Ambulatory Visit | Attending: Sports Medicine | Admitting: Sports Medicine

## 2021-07-27 DIAGNOSIS — M25511 Pain in right shoulder: Secondary | ICD-10-CM | POA: Diagnosis not present

## 2021-08-01 ENCOUNTER — Other Ambulatory Visit: Payer: Self-pay | Admitting: Interventional Cardiology

## 2021-08-02 ENCOUNTER — Other Ambulatory Visit: Payer: Self-pay | Admitting: *Deleted

## 2021-08-02 MED ORDER — LISINOPRIL 5 MG PO TABS: 5.0000 mg | ORAL_TABLET | Freq: Every day | ORAL | 3 refills | Status: DC

## 2021-08-02 MED ORDER — ROSUVASTATIN CALCIUM 40 MG PO TABS: 40.0000 mg | ORAL_TABLET | Freq: Every day | ORAL | 3 refills | Status: DC

## 2021-08-02 MED ORDER — METOPROLOL SUCCINATE ER 100 MG PO TB24: ORAL_TABLET | ORAL | 3 refills | Status: DC

## 2021-08-10 DIAGNOSIS — M546 Pain in thoracic spine: Secondary | ICD-10-CM | POA: Diagnosis not present

## 2021-08-10 DIAGNOSIS — M542 Cervicalgia: Secondary | ICD-10-CM | POA: Diagnosis not present

## 2021-08-10 DIAGNOSIS — G8929 Other chronic pain: Secondary | ICD-10-CM | POA: Diagnosis not present

## 2021-08-10 DIAGNOSIS — M62511 Muscle wasting and atrophy, not elsewhere classified, right shoulder: Secondary | ICD-10-CM | POA: Diagnosis not present

## 2021-08-13 NOTE — Progress Notes (Signed)
Cardiology Office Note:    Date:  08/16/2021   ID:  [name], DOB 12-07-1950, MRN [id number]  PCP:  Maury Dus, MD  Cardiologist:  None   Referring MD: Maury Dus, MD   Chief Complaint  Patient presents with   Coronary Artery Disease   Congestive Heart Failure   Shortness of Breath    History of Present Illness:    Eric [facility] is a 71 y.o. male with a hx of  Coronary artery disease, non-ST elevation myocardial infarction March 2015 and multivessel coronary bypass grafting March 2015 (LIMA to LAD, seq SVG to Diag and OM), hyperlipidemia, and prior smoker.   Notices dyspnea on exertion.  Denies orthopnea.  Does not exercise on a regular basis.  States he is compliant with medications.  Denies chest pain.  Orthopnea.  Trace lower extremity edema has been noted.  No palpitations, syncope, or neurological complaints.  Past Medical History:  Diagnosis Date   Myocardial infarction ([place])    non-STEMI 08/2013, CABG x 3 09/25/2013    Past Surgical History:  Procedure Laterality Date   CORONARY ARTERY BYPASS GRAFT N/A 09/25/2013   Procedure: CORONARY ARTERY BYPASS GRAFTING (CABG);  Surgeon: Melrose Nakayama, MD;  Location: [place];  Service: Open Heart Surgery;  Laterality: N/A;  Times 3 using left internal mammary artery and endoscopically harvested right saphenous vein.   INTRAOPERATIVE TRANSESOPHAGEAL ECHOCARDIOGRAM N/A 09/25/2013   Procedure: INTRAOPERATIVE TRANSESOPHAGEAL ECHOCARDIOGRAM;  Surgeon: Melrose Nakayama, MD;  Location: [place];  Service: Open Heart Surgery;  Laterality: N/A;   LEFT HEART CATHETERIZATION WITH CORONARY ANGIOGRAM N/A 09/25/2013   Procedure: LEFT HEART CATHETERIZATION WITH CORONARY ANGIOGRAM;  Surgeon: Birdie Riddle, MD;  Location: [place] CATH LAB;  Service: Cardiovascular;  Laterality: N/A;    Current Medications: Current Meds  Medication Sig   aspirin EC 81 MG tablet Take 1 tablet (81 mg total) by mouth daily.   beta carotene w/minerals  (OCUVITE) tablet Take 1 tablet by mouth daily.   lisinopril (ZESTRIL) 5 MG tablet Take 1 tablet (5 mg total) by mouth daily.   metoprolol succinate (TOPROL-XL) 100 MG 24 hr tablet TAKE 1 TABLET(100 MG) BY MOUTH DAILY WITH OR IMMEDIATELY FOLLOWING A MEAL   rosuvastatin (CRESTOR) 40 MG tablet Take 1 tablet (40 mg total) by mouth daily.     Allergies:   Patient has no known allergies.   Social History   Socioeconomic History   Marital status: Married    Spouse name: Not on file   Number of children: Not on file   Years of education: Not on file   Highest education level: Not on file  Occupational History   Occupation: real estate    Employer: [employer]  Tobacco Use   Smoking status: Former   Smokeless tobacco: Never  Substance and Sexual Activity   Alcohol use: No    Alcohol/week: 0.0 standard drinks   Drug use: No   Sexual activity: Yes  Other Topics Concern   Not on file  Social History Narrative   Not on file   Social Determinants of Health   Financial Resource Strain: Not on file  Food Insecurity: Not on file  Transportation Needs: Not on file  Physical Activity: Not on file  Stress: Not on file  Social Connections: Not on file     Family History: The patient's family history includes Coronary artery disease (age of onset: 33) in his father; Heart attack in his father; Heart disease in his father.  ROS:   Please see the history of present illness.    Last LDL was greater than 170 when done in November.  Denies medication noncompliance.  All other systems reviewed and are negative.  EKGs/Labs/Other Studies Reviewed:    The following studies were reviewed today: No cardiac imaging or functional testing since his bypass operation and 2015.  EKG:  EKG sinus bradycardia, 50 bpm.  Relatively short PR interval.  Left atrial abnormality.  Otherwise normal.  Recent Labs: No results found for requested labs within last 8760 hours.  Recent Lipid Panel     Component Value Date/Time   CHOL 148 12/24/2018 1601   TRIG 99 12/24/2018 1601   HDL 44 12/24/2018 1601   CHOLHDL 3.4 12/24/2018 1601   CHOLHDL 4.3 11/10/2015 0812   VLDL 15 11/10/2015 0812   LDLCALC 84 12/24/2018 1601    Physical Exam:    VS:  BP (!) 150/88    Pulse (!) 50    Ht 5\' 8"  (1.727 m)    Wt 191 lb (86.6 kg)    SpO2 96%    BMI 29.04 kg/m     Wt Readings from Last 3 Encounters:  08/16/21 191 lb (86.6 kg)  05/26/21 185 lb (83.9 kg)  02/18/20 187 lb 6.4 oz (85 kg)     GEN: Overweight.. No acute distress HEENT: Normal NECK: No JVD. LYMPHATICS: No lymphadenopathy CARDIAC: No murmur. RRR S4 gallop, or edema. VASCULAR:  Normal Pulses. No bruits. RESPIRATORY:  Clear to auscultation without rales, wheezing or rhonchi  ABDOMEN: Soft, non-tender, non-distended, No pulsatile mass, MUSCULOSKELETAL: No deformity  SKIN: Warm and dry NEUROLOGIC:  Alert and oriented x 3 PSYCHIATRIC:  Normal affect   ASSESSMENT:    1. Coronary artery disease involving coronary bypass graft of native heart with angina pectoris ([place])   2. Essential hypertension   3. Other hyperlipidemia   4. Shortness of breath    PLAN:    In order of problems listed above:  Secondary prevention discussed.  Lipids were poorly controlled in November.  We will reassess today. Add HCTZ 12.5 mg/day.  Will help blood pressure and may help his dyspnea. Reassess lipid panel. 2D Doppler echocardiogram, BNP, and myocardial perfusion imaging to rule out heart failure and progression of ischemic heart disease as the etiology for shortness of breath.   Overall education and awareness concerning secondary risk prevention was discussed in detail: LDL less than 70, hemoglobin A1c less than 7, blood pressure target less than 130/80 mmHg, >150 minutes of moderate aerobic activity per week, avoidance of smoking, weight control (via diet and exercise), and continued surveillance/management of/for obstructive sleep  apnea.    Medication Adjustments/Labs and Tests Ordered: Current medicines are reviewed at length with the patient today.  Concerns regarding medicines are outlined above.  Orders Placed This Encounter  Procedures   Lipid panel   Pro b natriuretic peptide   Basic metabolic panel   CBC   MYOCARDIAL PERFUSION IMAGING   EKG 12-Lead   ECHOCARDIOGRAM COMPLETE   No orders of the defined types were placed in this encounter.   Patient Instructions  Medication Instructions:  Your physician recommends that you continue on your current medications as directed. Please refer to the Current Medication list given to you today.  *If you need a refill on your cardiac medications before your next appointment, please call your pharmacy*   Lab Work: BMET, Lipid, CBC and Pro BNP today  If you have labs (blood work) drawn today and  your tests are completely normal, you will receive your results only by: MyChart Message (if you have MyChart) OR A paper copy in the mail If you have any lab test that is abnormal or we need to change your treatment, we will call you to review the results.   Testing/Procedures: Your physician has requested that you have an echocardiogram. Echocardiography is a painless test that uses sound waves to create images of your heart. It provides your doctor with information about the size and shape of your heart and how well your hearts chambers and valves are working. This procedure takes approximately one hour. There are no restrictions for this procedure.  Your physician has requested that you have a lexiscan myoview. For further information please visit [email or web address]. Please follow instruction sheet, as given.   Follow-Up: At [facility], you and your health needs are our priority.  As part of our continuing mission to provide you with exceptional heart care, we have created designated Provider Care Teams.  These Care Teams include your primary Cardiologist  (physician) and Advanced Practice Providers (APPs -  Physician Assistants and Nurse Practitioners) who all work together to provide you with the care you need, when you need it.  We recommend signing up for the patient portal called "MyChart".  Sign up information is provided on this After Visit Summary.  MyChart is used to connect with patients for Virtual Visits (Telemedicine).  Patients are able to view lab/test results, encounter notes, upcoming appointments, etc.  Non-urgent messages can be sent to your provider as well.   To learn more about what you can do with MyChart, go to [email or web address].    Your next appointment:   1 year(s)  The format for your next appointment:   In Person  Provider:   [facility]. Blenda Bridegroom, MD    Other Instructions     Signed, Sinclair Grooms, MD  08/16/2021 11:01 AM    [place]

## 2021-08-16 ENCOUNTER — Ambulatory Visit: Payer: Medicare Other | Admitting: Interventional Cardiology

## 2021-08-16 ENCOUNTER — Encounter: Payer: Self-pay | Admitting: *Deleted

## 2021-08-16 ENCOUNTER — Encounter: Payer: Self-pay | Admitting: Interventional Cardiology

## 2021-08-16 ENCOUNTER — Other Ambulatory Visit: Payer: Self-pay

## 2021-08-16 VITALS — BP 150/88 | HR 50 | Ht 68.0 in | Wt 191.0 lb

## 2021-08-16 DIAGNOSIS — I10 Essential (primary) hypertension: Secondary | ICD-10-CM | POA: Diagnosis not present

## 2021-08-16 DIAGNOSIS — E7849 Other hyperlipidemia: Secondary | ICD-10-CM

## 2021-08-16 DIAGNOSIS — R0602 Shortness of breath: Secondary | ICD-10-CM

## 2021-08-16 DIAGNOSIS — I25709 Atherosclerosis of coronary artery bypass graft(s), unspecified, with unspecified angina pectoris: Secondary | ICD-10-CM | POA: Diagnosis not present

## 2021-08-16 MED ORDER — HYDROCHLOROTHIAZIDE 12.5 MG PO CAPS: 12.5000 mg | ORAL_CAPSULE | Freq: Every day | ORAL | 3 refills | Status: DC

## 2021-08-16 NOTE — Patient Instructions (Addendum)
Medication Instructions:  1) START Hydrochlorothiazide 12.5mg  once daily  *If you need a refill on your cardiac medications before your next appointment, please call your pharmacy*   Lab Work: BMET, Lipid, CBC and Pro BNP today  BMET when you come back for testing (needs to be at least a week after starting medication)  If you have labs (blood work) drawn today and your tests are completely normal, you will receive your results only by: [place] (if you have MyChart) OR A paper copy in the mail If you have any lab test that is abnormal or we need to change your treatment, we will call you to review the results.   Testing/Procedures: Your physician has requested that you have an echocardiogram. Echocardiography is a painless test that uses sound waves to create images of your heart. It provides your doctor with information about the size and shape of your heart and how well your hearts chambers and valves are working. This procedure takes approximately one hour. There are no restrictions for this procedure.  Your physician has requested that you have a lexiscan myoview. For further information please visit [email or web address]. Please follow instruction sheet, as given.   Follow-Up: At [facility], you and your health needs are our priority.  As part of our continuing mission to provide you with exceptional heart care, we have created designated Provider Care Teams.  These Care Teams include your primary Cardiologist (physician) and Advanced Practice Providers (APPs -  Physician Assistants and Nurse Practitioners) who all work together to provide you with the care you need, when you need it.  We recommend signing up for the patient portal called "MyChart".  Sign up information is provided on this After Visit Summary.  MyChart is used to connect with patients for Virtual Visits (Telemedicine).  Patients are able to view lab/test results, encounter notes, upcoming appointments, etc.   Non-urgent messages can be sent to your provider as well.   To learn more about what you can do with MyChart, go to [email or web address].    Your next appointment:   1 year(s)  The format for your next appointment:   In Person  Provider:   [facility]. Blenda Bridegroom, MD    Other Instructions

## 2021-08-18 LAB — BASIC METABOLIC PANEL
BUN/Creatinine Ratio: 14 (ref 10–24)
BUN: 12 mg/dL (ref 8–27)
CO2: 25 mmol/L (ref 20–29)
Calcium: 9.6 mg/dL (ref 8.6–10.2)
Chloride: 98 mmol/L (ref 96–106)
Creatinine, Ser: 0.85 mg/dL (ref 0.76–1.27)
Glucose: 99 mg/dL (ref 70–99)
Potassium: 4.3 mmol/L (ref 3.5–5.2)
Sodium: 138 mmol/L (ref 134–144)
eGFR: 93 mL/min/{1.73_m2} (ref >59)

## 2021-08-18 LAB — LIPID PANEL
Chol/HDL Ratio: 3 ratio (ref 0.0–5.0)
Cholesterol, Total: 133 mg/dL (ref 100–199)
HDL: 45 mg/dL (ref >39)
LDL Chol Calc (NIH): 69 mg/dL (ref 0–99)
Triglycerides: 99 mg/dL (ref 0–149)
VLDL Cholesterol Cal: 19 mg/dL (ref 5–40)

## 2021-08-18 LAB — CBC
Hematocrit: 51.2 % — ABNORMAL HIGH (ref 37.5–51.0)
Hemoglobin: 17.5 g/dL (ref 13.0–17.7)
MCH: 31.3 pg (ref 26.6–33.0)
MCHC: 34.2 g/dL (ref 31.5–35.7)
MCV: 92 fL (ref 79–97)
Platelets: 189 10*3/uL (ref 150–450)
RBC: 5.59 x10E6/uL (ref 4.14–5.80)
RDW: 12.9 % (ref 11.6–15.4)
WBC: 6.2 10*3/uL (ref 3.4–10.8)

## 2021-08-18 LAB — PRO B NATRIURETIC PEPTIDE: NT-Pro BNP: 326 pg/mL (ref 0–376)

## 2021-08-23 ENCOUNTER — Telehealth (HOSPITAL_COMMUNITY): Payer: Self-pay | Admitting: *Deleted

## 2021-08-23 NOTE — Telephone Encounter (Signed)
Left message on voicemail per DPR in reference to upcoming appointment scheduled on  08/30/21 with detailed instructions given per Myocardial Perfusion Study Information Sheet for the test. LM to arrive 15 minutes early, and that it is imperative to arrive on time for appointment to keep from having the test rescheduled. If you need to cancel or reschedule your appointment, please call the office within 24 hours of your appointment. Failure to do so may result in a cancellation of your appointment, and a $50 no show fee. Phone number given for call back for any questions. Joshwa Hemric Jacqueline ° ° °

## 2021-08-30 ENCOUNTER — Other Ambulatory Visit: Payer: Self-pay

## 2021-08-30 ENCOUNTER — Ambulatory Visit (HOSPITAL_BASED_OUTPATIENT_CLINIC_OR_DEPARTMENT_OTHER): Payer: Medicare Other

## 2021-08-30 ENCOUNTER — Other Ambulatory Visit: Payer: Medicare Other | Admitting: *Deleted

## 2021-08-30 ENCOUNTER — Ambulatory Visit (HOSPITAL_COMMUNITY): Payer: Medicare Other | Attending: Cardiovascular Disease

## 2021-08-30 DIAGNOSIS — I25709 Atherosclerosis of coronary artery bypass graft(s), unspecified, with unspecified angina pectoris: Secondary | ICD-10-CM

## 2021-08-30 DIAGNOSIS — E7849 Other hyperlipidemia: Secondary | ICD-10-CM

## 2021-08-30 DIAGNOSIS — R0602 Shortness of breath: Secondary | ICD-10-CM | POA: Diagnosis not present

## 2021-08-30 LAB — ECHOCARDIOGRAM COMPLETE
Area-P 1/2: 2.93 cm2
Height: 68 in
S' Lateral: 3.3 cm
Weight: 3056 oz

## 2021-08-30 LAB — MYOCARDIAL PERFUSION IMAGING
LV dias vol: 84 mL (ref 62–150)
LV sys vol: 30 mL
Nuc Stress EF: 64 %
Peak HR: 82 {beats}/min
Rest HR: 50 {beats}/min
Rest Nuclear Isotope Dose: 10.2 mCi
SDS: 2
SRS: 1
SSS: 3
ST Depression (mm): 0 mm
Stress Nuclear Isotope Dose: 32.9 mCi
TID: 0.97

## 2021-08-30 LAB — BASIC METABOLIC PANEL
BUN/Creatinine Ratio: 19 (ref 10–24)
BUN: 18 mg/dL (ref 8–27)
CO2: 27 mmol/L (ref 20–29)
Calcium: 9.9 mg/dL (ref 8.6–10.2)
Chloride: 99 mmol/L (ref 96–106)
Creatinine, Ser: 0.95 mg/dL (ref 0.76–1.27)
Glucose: 97 mg/dL (ref 70–99)
Potassium: 4.4 mmol/L (ref 3.5–5.2)
Sodium: 139 mmol/L (ref 134–144)
eGFR: 86 mL/min/{1.73_m2} (ref >59)

## 2021-08-30 MED ORDER — TECHNETIUM TC 99M TETROFOSMIN IV KIT
10.2000 | PACK | Freq: Once | INTRAVENOUS | Status: AC | PRN
  Administered 2021-08-30: 10.2 via INTRAVENOUS
  Filled 2021-08-30: qty 11

## 2021-08-30 MED ORDER — REGADENOSON 0.4 MG/5ML IV SOLN
0.4000 mg | Freq: Once | INTRAVENOUS | Status: AC
  Administered 2021-08-30: 0.4 mg via INTRAVENOUS

## 2021-08-30 MED ORDER — TECHNETIUM TC 99M TETROFOSMIN IV KIT
32.9000 | PACK | Freq: Once | INTRAVENOUS | Status: AC | PRN
  Administered 2021-08-30: 32.9 via INTRAVENOUS
  Filled 2021-08-30: qty 33

## 2021-09-08 DIAGNOSIS — M47814 Spondylosis without myelopathy or radiculopathy, thoracic region: Secondary | ICD-10-CM | POA: Diagnosis not present

## 2021-09-08 DIAGNOSIS — M546 Pain in thoracic spine: Secondary | ICD-10-CM | POA: Diagnosis not present

## 2021-09-08 DIAGNOSIS — M47812 Spondylosis without myelopathy or radiculopathy, cervical region: Secondary | ICD-10-CM | POA: Diagnosis not present

## 2021-09-08 DIAGNOSIS — M4802 Spinal stenosis, cervical region: Secondary | ICD-10-CM | POA: Diagnosis not present

## 2021-09-08 DIAGNOSIS — G8929 Other chronic pain: Secondary | ICD-10-CM | POA: Diagnosis not present

## 2021-09-08 DIAGNOSIS — M5126 Other intervertebral disc displacement, lumbar region: Secondary | ICD-10-CM | POA: Diagnosis not present

## 2021-09-08 DIAGNOSIS — M542 Cervicalgia: Secondary | ICD-10-CM | POA: Diagnosis not present

## 2021-09-08 DIAGNOSIS — M5023 Other cervical disc displacement, cervicothoracic region: Secondary | ICD-10-CM | POA: Diagnosis not present

## 2021-10-24 DIAGNOSIS — H353213 Exudative age-related macular degeneration, right eye, with inactive scar: Secondary | ICD-10-CM | POA: Diagnosis not present

## 2021-10-24 DIAGNOSIS — H353122 Nonexudative age-related macular degeneration, left eye, intermediate dry stage: Secondary | ICD-10-CM | POA: Diagnosis not present

## 2021-10-25 DIAGNOSIS — M419 Scoliosis, unspecified: Secondary | ICD-10-CM | POA: Diagnosis not present

## 2021-10-25 DIAGNOSIS — M62511 Muscle wasting and atrophy, not elsewhere classified, right shoulder: Secondary | ICD-10-CM | POA: Diagnosis not present

## 2021-10-25 DIAGNOSIS — M47812 Spondylosis without myelopathy or radiculopathy, cervical region: Secondary | ICD-10-CM | POA: Diagnosis not present

## 2021-10-25 DIAGNOSIS — Z931 Gastrostomy status: Secondary | ICD-10-CM | POA: Diagnosis not present

## 2021-10-25 DIAGNOSIS — M4186 Other forms of scoliosis, lumbar region: Secondary | ICD-10-CM | POA: Diagnosis not present

## 2021-10-25 DIAGNOSIS — I700 Atherosclerosis of aorta: Secondary | ICD-10-CM | POA: Diagnosis not present

## 2021-10-25 DIAGNOSIS — F17210 Nicotine dependence, cigarettes, uncomplicated: Secondary | ICD-10-CM | POA: Diagnosis not present

## 2021-10-25 DIAGNOSIS — Z9889 Other specified postprocedural states: Secondary | ICD-10-CM | POA: Diagnosis not present

## 2021-10-25 DIAGNOSIS — M47815 Spondylosis without myelopathy or radiculopathy, thoracolumbar region: Secondary | ICD-10-CM | POA: Diagnosis not present

## 2021-10-25 DIAGNOSIS — M5136 Other intervertebral disc degeneration, lumbar region: Secondary | ICD-10-CM | POA: Diagnosis not present

## 2021-11-13 DIAGNOSIS — M7541 Impingement syndrome of right shoulder: Secondary | ICD-10-CM | POA: Diagnosis not present

## 2021-12-05 DIAGNOSIS — Z789 Other specified health status: Secondary | ICD-10-CM | POA: Diagnosis not present

## 2021-12-05 DIAGNOSIS — Z7409 Other reduced mobility: Secondary | ICD-10-CM | POA: Diagnosis not present

## 2021-12-05 DIAGNOSIS — R6889 Other general symptoms and signs: Secondary | ICD-10-CM | POA: Diagnosis not present

## 2021-12-05 DIAGNOSIS — R293 Abnormal posture: Secondary | ICD-10-CM | POA: Diagnosis not present

## 2021-12-05 DIAGNOSIS — M7541 Impingement syndrome of right shoulder: Secondary | ICD-10-CM | POA: Diagnosis not present

## 2021-12-12 DIAGNOSIS — Z789 Other specified health status: Secondary | ICD-10-CM | POA: Diagnosis not present

## 2021-12-12 DIAGNOSIS — R29898 Other symptoms and signs involving the musculoskeletal system: Secondary | ICD-10-CM | POA: Diagnosis not present

## 2021-12-12 DIAGNOSIS — R293 Abnormal posture: Secondary | ICD-10-CM | POA: Diagnosis not present

## 2021-12-12 DIAGNOSIS — M545 Low back pain, unspecified: Secondary | ICD-10-CM | POA: Diagnosis not present

## 2021-12-12 DIAGNOSIS — M7541 Impingement syndrome of right shoulder: Secondary | ICD-10-CM | POA: Diagnosis not present

## 2021-12-12 DIAGNOSIS — R6889 Other general symptoms and signs: Secondary | ICD-10-CM | POA: Diagnosis not present

## 2021-12-12 DIAGNOSIS — Z7409 Other reduced mobility: Secondary | ICD-10-CM | POA: Diagnosis not present

## 2021-12-13 DIAGNOSIS — H903 Sensorineural hearing loss, bilateral: Secondary | ICD-10-CM | POA: Diagnosis not present

## 2021-12-19 DIAGNOSIS — R293 Abnormal posture: Secondary | ICD-10-CM | POA: Diagnosis not present

## 2021-12-19 DIAGNOSIS — Z789 Other specified health status: Secondary | ICD-10-CM | POA: Diagnosis not present

## 2021-12-19 DIAGNOSIS — M7541 Impingement syndrome of right shoulder: Secondary | ICD-10-CM | POA: Diagnosis not present

## 2021-12-19 DIAGNOSIS — Z7409 Other reduced mobility: Secondary | ICD-10-CM | POA: Diagnosis not present

## 2021-12-19 DIAGNOSIS — R29898 Other symptoms and signs involving the musculoskeletal system: Secondary | ICD-10-CM | POA: Diagnosis not present

## 2021-12-19 DIAGNOSIS — R6889 Other general symptoms and signs: Secondary | ICD-10-CM | POA: Diagnosis not present

## 2022-01-01 DIAGNOSIS — L821 Other seborrheic keratosis: Secondary | ICD-10-CM | POA: Diagnosis not present

## 2022-01-01 DIAGNOSIS — D1801 Hemangioma of skin and subcutaneous tissue: Secondary | ICD-10-CM | POA: Diagnosis not present

## 2022-01-01 DIAGNOSIS — D489 Neoplasm of uncertain behavior, unspecified: Secondary | ICD-10-CM | POA: Diagnosis not present

## 2022-01-01 DIAGNOSIS — L814 Other melanin hyperpigmentation: Secondary | ICD-10-CM | POA: Diagnosis not present

## 2022-01-01 DIAGNOSIS — L570 Actinic keratosis: Secondary | ICD-10-CM | POA: Diagnosis not present

## 2022-01-01 DIAGNOSIS — C44719 Basal cell carcinoma of skin of left lower limb, including hip: Secondary | ICD-10-CM | POA: Diagnosis not present

## 2022-01-01 DIAGNOSIS — Z85820 Personal history of malignant melanoma of skin: Secondary | ICD-10-CM | POA: Diagnosis not present

## 2022-01-01 DIAGNOSIS — C44712 Basal cell carcinoma of skin of right lower limb, including hip: Secondary | ICD-10-CM | POA: Diagnosis not present

## 2022-04-12 DIAGNOSIS — C44719 Basal cell carcinoma of skin of left lower limb, including hip: Secondary | ICD-10-CM | POA: Diagnosis not present

## 2022-04-12 DIAGNOSIS — C44712 Basal cell carcinoma of skin of right lower limb, including hip: Secondary | ICD-10-CM | POA: Diagnosis not present

## 2022-05-07 DIAGNOSIS — H353122 Nonexudative age-related macular degeneration, left eye, intermediate dry stage: Secondary | ICD-10-CM | POA: Diagnosis not present

## 2022-05-07 DIAGNOSIS — H353213 Exudative age-related macular degeneration, right eye, with inactive scar: Secondary | ICD-10-CM | POA: Diagnosis not present

## 2022-05-17 DIAGNOSIS — H3530 Unspecified macular degeneration: Secondary | ICD-10-CM | POA: Diagnosis not present

## 2022-05-17 DIAGNOSIS — Z131 Encounter for screening for diabetes mellitus: Secondary | ICD-10-CM | POA: Diagnosis not present

## 2022-05-17 DIAGNOSIS — Z0000 Encounter for general adult medical examination without abnormal findings: Secondary | ICD-10-CM | POA: Diagnosis not present

## 2022-08-07 DIAGNOSIS — H353120 Nonexudative age-related macular degeneration, left eye, stage unspecified: Secondary | ICD-10-CM | POA: Diagnosis not present

## 2022-08-13 ENCOUNTER — Other Ambulatory Visit: Payer: Self-pay | Admitting: *Deleted

## 2022-08-13 ENCOUNTER — Other Ambulatory Visit: Payer: Self-pay

## 2022-08-13 MED ORDER — HYDROCHLOROTHIAZIDE 12.5 MG PO CAPS: 12.5000 mg | ORAL_CAPSULE | Freq: Every day | ORAL | 0 refills | Status: DC

## 2022-08-13 NOTE — Telephone Encounter (Signed)
Hctz refill sent per request from [facility]

## 2022-10-04 ENCOUNTER — Telehealth: Payer: Self-pay | Admitting: Cardiology

## 2022-10-04 NOTE — Telephone Encounter (Signed)
Called and spoke directly with patient who states that he is still taking his Rosuvastatin and Lisinopril daily and has not missed doses. He says that he fills with [facility] in 90 day intervals and has enough to carry him through this month. Scheduled to see [place] on 10/29/22.

## 2022-10-04 NOTE — Telephone Encounter (Signed)
-----   Message from Rodman Key, RN sent at 10/02/2022  3:35 PM EDT ----- Regarding: FW: Medication Adherence  ----- Message ----- From: Delight Stare, CPhT Sent: 10/01/2022   1:14 PM EDT To: Rodman Key, RN Subject: Medication Adherence                           Irene Limbo,   I am working on some medication adherence for patients, and came across Mr. Inscoe. He has not filled his Lisinopril or rosuvastatin since 1/26 for a 30 days supply, If he is still taking these medications can we get a 100days supply sent into his pharmacy and see if that will help him be more compliant.  Please let me know if there is anything can do to help.  Thanks so much for your help  Lelon Huh, [place] [phone]

## 2022-10-08 DIAGNOSIS — H903 Sensorineural hearing loss, bilateral: Secondary | ICD-10-CM | POA: Diagnosis not present

## 2022-10-10 ENCOUNTER — Other Ambulatory Visit: Payer: Self-pay | Admitting: Nurse Practitioner

## 2022-10-10 ENCOUNTER — Other Ambulatory Visit: Payer: Self-pay

## 2022-10-10 MED ORDER — LISINOPRIL 5 MG PO TABS: 5.0000 mg | ORAL_TABLET | Freq: Every day | ORAL | 0 refills | Status: DC

## 2022-10-13 NOTE — Progress Notes (Unsigned)
Cardiology Office Note:    Date:  10/16/2022   ID:  Eric Warren, DOB [birth year], MRN [id number]  PCP:  Elias Else, MD (Inactive)   [facility] Providers Cardiologist:  None     Referring MD: No ref. provider found   Chief Complaint: follow-up CAD  History of Present Illness:    Eric Warren is a very pleasant 72 y.o. male with a hx of CAD, non-ST elevation myocardial infarction March 2015 and multivessel coronary bypass grafting March 2015 (LIMA to LAD, seq SVG to diagonal and OM), HLD, and former tobacco use.  He reported symptoms at time of MI were as if food was hung in his esophagus causing chest discomfort. Also had some nausea and weakness. He has maintained consistent follow-up without coronary intervention.   Last cardiology clinic visit was 08/16/2021 with Dr. Katrinka Blazing at which time BP was elevated.  He was also having some dyspnea.  HCTZ 12.5 mg daily was added.  BNP, CBC, BMP, lipid panel all obtained at that visit.  LDL cholesterol well-controlled at 69.  TTE was completed on 08/30/2021 and revealed normal LVEF 60 to 65%, no RWMA, normal diastolic parameters, normal RV, aortic valve sclerosis without any evidence of stenosis.  Lexiscan Myoview 08/30/2021 revealed no evidence of ischemia or infarction, normal LV function, low risk study. He was advised to increase aerobic exercise and report worsening symptoms.  Today, he is here for follow-up. He is here alone. Reports he has been doing well. Stamina is not what it was in the past but recognizes he has decreased his physical activity. Previously played golf frequently, does not play much. Does not exercise consistently. Has a lot of inclines in his neighborhood, admits he needs to just get out and walk. On 1-2 days per week, he has blurred vision in his left eye. Has had blurred vision in right eye since cataract surgery but vision in left eye is usually clear.  Has not monitored HR or BP during these episodes.  Reports it  persist throughout the day.  No associated lightheadedness, n/v, or other concerning symptoms. Wonders if it occurs more frequently in setting of elevated sodium intake the day prior. 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  No chest pain, palpitations, dyspnea, orthopnea, PND, edema, presyncope, syncope.  Past Medical History:  Diagnosis Date   Myocardial infarction    non-STEMI 08/2013, CABG x 3 09/25/2013    Past Surgical History:  Procedure Laterality Date   CORONARY ARTERY BYPASS GRAFT N/A 09/25/2013   Procedure: CORONARY ARTERY BYPASS GRAFTING (CABG);  Surgeon: Loreli Slot, MD;  Location: [facility] OR;  Service: Open Heart Surgery;  Laterality: N/A;  Times 3 using left internal mammary artery and endoscopically harvested right saphenous vein.   INTRAOPERATIVE TRANSESOPHAGEAL ECHOCARDIOGRAM N/A 09/25/2013   Procedure: INTRAOPERATIVE TRANSESOPHAGEAL ECHOCARDIOGRAM;  Surgeon: Loreli Slot, MD;  Location: [facility] OR;  Service: Open Heart Surgery;  Laterality: N/A;   LEFT HEART CATHETERIZATION WITH CORONARY ANGIOGRAM N/A 09/25/2013   Procedure: LEFT HEART CATHETERIZATION WITH CORONARY ANGIOGRAM;  Surgeon: Ricki Rodriguez, MD;  Location: [facility] CATH LAB;  Service: Cardiovascular;  Laterality: N/A;    Current Medications: Current Meds  Medication Sig   aspirin EC 81 MG tablet Take 1 tablet (81 mg total) by mouth daily.   beta carotene w/minerals (OCUVITE) tablet Take 1 tablet by mouth daily.   hydrochlorothiazide (MICROZIDE) 12.5 MG capsule Take 1 capsule (12.5 mg total) by mouth daily.  lisinopril (ZESTRIL) 5 MG tablet Take 1 tablet (5 mg total) by mouth daily.   metoprolol succinate (TOPROL-XL) 100 MG 24 hr tablet TAKE 1 TABLET(100 MG) BY MOUTH DAILY WITH OR IMMEDIATELY FOLLOWING A MEAL   rosuvastatin (CRESTOR) 40 MG tablet Take 1 tablet (40 mg total) by mouth daily.     Allergies:   Patient has no known allergies.   Social History   Socioeconomic History    Marital status: Married    Spouse name: Not on file   Number of children: Not on file   Years of education: Not on file   Highest education level: Not on file  Occupational History   Occupation: real estate    Employer: [employer]  Tobacco Use   Smoking status: Former   Smokeless tobacco: Never  Substance and Sexual Activity   Alcohol use: No    Alcohol/week: 0.0 standard drinks of alcohol   Drug use: No   Sexual activity: Yes  Other Topics Concern   Not on file  Social History Narrative   Not on file   Social Determinants of Health   Financial Resource Strain: Not on file  Food Insecurity: Not on file  Transportation Needs: Not on file  Physical Activity: Not on file  Stress: Not on file  Social Connections: Not on file     Family History: The patient's family history includes Coronary artery disease (age of onset: 57) in his father; Heart attack in his father; Heart disease in his father.  ROS:   Please see the history of present illness.    + blurred vision All other systems reviewed and are negative.  Labs/Other Studies Reviewed:    The following studies were reviewed today:  Lexiscan Myoview 08/30/21   The study is normal. The study is low risk.   No ST deviation was noted.   LV perfusion is normal. There is no evidence of ischemia. There is no evidence of infarction.   Left ventricular function is normal. Nuclear stress EF: 64 %. The left ventricular ejection fraction is normal (55-65%). End diastolic cavity size is normal. End systolic cavity size is normal.   Prior study not available for comparison.   Normal resting and stress perfusion. No ischemia or infarction EF 64%  Echo 08/30/21 1. Left ventricular ejection fraction, by estimation, is 60 to 65%. The  left ventricle has normal function. The left ventricle has no regional  wall motion abnormalities. Left ventricular diastolic parameters were  normal. The average left ventricular  global  longitudinal strain is -20.1 %. The global longitudinal strain is  normal.   2. Right ventricular systolic function is normal. The right ventricular  size is normal. Tricuspid regurgitation signal is inadequate for assessing  PA pressure.   3. The mitral valve is degenerative. Trivial mitral valve regurgitation.  No evidence of mitral stenosis.   4. The aortic valve is tricuspid. Aortic valve regurgitation is not  visualized. Aortic valve sclerosis/calcification is present, without any  evidence of aortic stenosis.   5. The inferior vena cava is normal in size with greater than 50%  respiratory variability, suggesting right atrial pressure of 3 mmHg.   Recent Labs: No results found for requested labs within last 365 days.  Recent Lipid Panel    Component Value Date/Time   CHOL 133 08/16/2021 1124   TRIG 99 08/16/2021 1124   HDL 45 08/16/2021 1124   CHOLHDL 3.0 08/16/2021 1124   CHOLHDL 4.3 11/10/2015 0812   VLDL  15 11/10/2015 0812   LDLCALC 69 08/16/2021 1124     Risk Assessment/Calculations:       Physical Exam:    VS:  BP 130/74   Pulse (!) 55   Ht 5\' 8"  (1.727 m)   Wt 197 lb 3.2 oz (89.4 kg)   SpO2 96%   BMI 29.98 kg/m     Wt Readings from Last 3 Encounters:  10/16/22 197 lb 3.2 oz (89.4 kg)  08/30/21 191 lb (86.6 kg)  08/16/21 191 lb (86.6 kg)     GEN:  Well nourished, well developed in no acute distress HEENT: Normal NECK: No JVD; Bilateral soft carotid bruits CARDIAC: RRR, no murmurs, rubs, gallops RESPIRATORY:  Clear to auscultation without rales, wheezing or rhonchi  ABDOMEN: Soft, non-tender, non-distended MUSCULOSKELETAL:  No edema; No deformity. 2+ pedal pulses, equal bilaterally SKIN: Warm and dry NEUROLOGIC:  Alert and oriented x 3 PSYCHIATRIC:  Normal affect   EKG:  EKG is ordered today.  The ekg ordered today demonstrates sinus bradycardia at 55 bpm, no ST abnormality    Diagnoses:    1. Coronary artery disease involving native coronary  artery of native heart without angina pectoris   2. Essential hypertension   3. Hyperlipidemia LDL goal <70   4. Bruit   5. S/P CABG x 3   6. Blurred vision, left eye    Assessment and Plan:     CAD without angina: S/p CABG x 3 2015.  Nuclear stress test 08/30/2021 revealed low risk study, no evidence of ischemia or infarction. He denies chest pain, dyspnea, or other symptoms concerning for angina. No indication for further ischemic evaluation at this time. Secondary prevention emphasized with 150 minutes moderate intensity exercise each week along with heart healthy, mostly plant-based diet.  No bleeding concerns. Continue GDMT including aspirin, lisinopril, metoprolol, rosuvastatin.  Blurred vision: Episodes of blurred vision and left eye that occur 1-2 times weekly. Not associated with additional symptoms. Is not aware of HR or BP readings during these events. He has soft carotid bruit on exam. We will get vascular ultrasound for further evaluation. Encouraged him to follow-up with ophthalmologist.  Hyperlipidemia LDL goal < 55: LDL 69 on 08/16/2021. We will recheck today. Continue rosuvastatin.  Hypertension: BP is well controlled. Encouraged him to monitor consistently in the setting of blurred vision. No medication changes today.   Bruit: Bilateral soft carotid bruits. No lightheadedness, presyncope, syncope, or TIA symptoms. We will get vascular carotid duplex for mapping of neck arteries.      Disposition: 6 months with Dr. Shari Prows  Medication Adjustments/Labs and Tests Ordered: Current medicines are reviewed at length with the patient today.  Concerns regarding medicines are outlined above.  Orders Placed This Encounter  Procedures   Comp Met (CMET)   Lipid Profile   CBC   EKG 12-Lead   VAS US CAROTID   No orders of the defined types were placed in this encounter.   Patient Instructions  Medication Instructions:   Your physician recommends that you continue on your  current medications as directed. Please refer to the Current Medication list given to you today.   *If you need a refill on your cardiac medications before your next appointment, please call your pharmacy*   Lab Work:  TODAY!!!! LIPID/CMET/CBC  If you have labs (blood work) drawn today and your tests are completely normal, you will receive your results only by: MyChart Message (if you have MyChart) OR A paper copy in the mail If  you have any lab test that is abnormal or we need to change your treatment, we will call you to review the results.   Testing/Procedures:  Your physician has requested that you have a carotid duplex. This test is an ultrasound of the carotid arteries in your neck. It looks at blood flow through these arteries that supply the brain with blood. Allow one hour for this exam. There are no restrictions or special instructions.    Follow-Up: At [facility], you and your health needs are our priority.  As part of our continuing mission to provide you with exceptional heart care, we have created designated Provider Care Teams.  These Care Teams include your primary Cardiologist (physician) and Advanced Practice Providers (APPs -  Physician Assistants and Nurse Practitioners) who all work together to provide you with the care you need, when you need it.  We recommend signing up for the patient portal called "MyChart".  Sign up information is provided on this After Visit Summary.  MyChart is used to connect with patients for Virtual Visits (Telemedicine).  Patients are able to view lab/test results, encounter notes, upcoming appointments, etc.  Non-urgent messages can be sent to your provider as well.   To learn more about what you can do with MyChart, go to [email or web address].    Your next appointment:   6 month(s)  Provider:    Gust Rung  Other Instructions  Your physician wants you to follow-up in: 6 months with Dr. Shari Prows.  You will  receive a reminder letter in the mail two months in advance. If you don't receive a letter, please call our office to schedule the follow-up appointment.     Signed, Levi Aland, NP  10/16/2022 1:23 PM    [facility]

## 2022-10-15 ENCOUNTER — Encounter: Payer: Self-pay | Admitting: *Deleted

## 2022-10-16 ENCOUNTER — Encounter: Payer: Self-pay | Admitting: Nurse Practitioner

## 2022-10-16 ENCOUNTER — Ambulatory Visit: Payer: Medicare Other | Attending: Cardiology | Admitting: Nurse Practitioner

## 2022-10-16 VITALS — BP 130/74 | HR 55 | Ht 68.0 in | Wt 197.2 lb

## 2022-10-16 DIAGNOSIS — E785 Hyperlipidemia, unspecified: Secondary | ICD-10-CM | POA: Diagnosis not present

## 2022-10-16 DIAGNOSIS — I10 Essential (primary) hypertension: Secondary | ICD-10-CM

## 2022-10-16 DIAGNOSIS — H538 Other visual disturbances: Secondary | ICD-10-CM | POA: Diagnosis not present

## 2022-10-16 DIAGNOSIS — R0989 Other specified symptoms and signs involving the circulatory and respiratory systems: Secondary | ICD-10-CM | POA: Diagnosis not present

## 2022-10-16 DIAGNOSIS — I25709 Atherosclerosis of coronary artery bypass graft(s), unspecified, with unspecified angina pectoris: Secondary | ICD-10-CM | POA: Diagnosis not present

## 2022-10-16 DIAGNOSIS — I2510 Atherosclerotic heart disease of native coronary artery without angina pectoris: Secondary | ICD-10-CM | POA: Diagnosis not present

## 2022-10-16 DIAGNOSIS — Z951 Presence of aortocoronary bypass graft: Secondary | ICD-10-CM

## 2022-10-16 NOTE — Patient Instructions (Signed)
Medication Instructions:   Your physician recommends that you continue on your current medications as directed. Please refer to the Current Medication list given to you today.   *If you need a refill on your cardiac medications before your next appointment, please call your pharmacy*   Lab Work:  TODAY!!!! LIPID/CMET/CBC  If you have labs (blood work) drawn today and your tests are completely normal, you will receive your results only by: MyChart Message (if you have MyChart) OR A paper copy in the mail If you have any lab test that is abnormal or we need to change your treatment, we will call you to review the results.   Testing/Procedures:  Your physician has requested that you have a carotid duplex. This test is an ultrasound of the carotid arteries in your neck. It looks at blood flow through these arteries that supply the brain with blood. Allow one hour for this exam. There are no restrictions or special instructions.    Follow-Up: At [facility], you and your health needs are our priority.  As part of our continuing mission to provide you with exceptional heart care, we have created designated Provider Care Teams.  These Care Teams include your primary Cardiologist (physician) and Advanced Practice Providers (APPs -  Physician Assistants and Nurse Practitioners) who all work together to provide you with the care you need, when you need it.  We recommend signing up for the patient portal called "MyChart".  Sign up information is provided on this After Visit Summary.  MyChart is used to connect with patients for Virtual Visits (Telemedicine).  Patients are able to view lab/test results, encounter notes, upcoming appointments, etc.  Non-urgent messages can be sent to your provider as well.   To learn more about what you can do with MyChart, go to [email or web address].    Your next appointment:   6 month(s)  Provider:    Gust Rung  Other  Instructions  Your physician wants you to follow-up in: 6 months with Dr. Shari Prows.  You will receive a reminder letter in the mail two months in advance. If you don't receive a letter, please call our office to schedule the follow-up appointment.

## 2022-10-17 ENCOUNTER — Telehealth: Payer: Self-pay | Admitting: *Deleted

## 2022-10-17 DIAGNOSIS — E785 Hyperlipidemia, unspecified: Secondary | ICD-10-CM

## 2022-10-17 LAB — COMPREHENSIVE METABOLIC PANEL
ALT: 21 IU/L (ref 0–44)
AST: 21 IU/L (ref 0–40)
Albumin/Globulin Ratio: 2 (ref 1.2–2.2)
Albumin: 4.4 g/dL (ref 3.8–4.8)
Alkaline Phosphatase: 57 IU/L (ref 44–121)
BUN/Creatinine Ratio: 19 (ref 10–24)
BUN: 15 mg/dL (ref 8–27)
Bilirubin Total: 1.1 mg/dL (ref 0.0–1.2)
CO2: 21 mmol/L (ref 20–29)
Calcium: 9.8 mg/dL (ref 8.6–10.2)
Chloride: 99 mmol/L (ref 96–106)
Creatinine, Ser: 0.79 mg/dL (ref 0.76–1.27)
Globulin, Total: 2.2 g/dL (ref 1.5–4.5)
Glucose: 94 mg/dL (ref 70–99)
Potassium: 4.3 mmol/L (ref 3.5–5.2)
Sodium: 139 mmol/L (ref 134–144)
Total Protein: 6.6 g/dL (ref 6.0–8.5)
eGFR: 94 mL/min/{1.73_m2} (ref >59)

## 2022-10-17 LAB — CBC
Hematocrit: 50.3 % (ref 37.5–51.0)
Hemoglobin: 17.4 g/dL (ref 13.0–17.7)
MCH: 31.2 pg (ref 26.6–33.0)
MCHC: 34.6 g/dL (ref 31.5–35.7)
MCV: 90 fL (ref 79–97)
Platelets: 210 10*3/uL (ref 150–450)
RBC: 5.58 x10E6/uL (ref 4.14–5.80)
RDW: 13.1 % (ref 11.6–15.4)
WBC: 8.4 10*3/uL (ref 3.4–10.8)

## 2022-10-17 LAB — LIPID PANEL
Chol/HDL Ratio: 3.5 ratio (ref 0.0–5.0)
Cholesterol, Total: 145 mg/dL (ref 100–199)
HDL: 42 mg/dL (ref >39)
LDL Chol Calc (NIH): 85 mg/dL (ref 0–99)
Triglycerides: 95 mg/dL (ref 0–149)
VLDL Cholesterol Cal: 18 mg/dL (ref 5–40)

## 2022-10-17 MED ORDER — EZETIMIBE 10 MG PO TABS: 10.0000 mg | ORAL_TABLET | Freq: Every day | ORAL | 3 refills | Status: DC

## 2022-10-17 NOTE — Telephone Encounter (Signed)
Pt has been made aware of his lab results / recommendations.

## 2022-10-29 ENCOUNTER — Ambulatory Visit: Payer: Medicare Other | Admitting: Cardiology

## 2022-10-30 DIAGNOSIS — H905 Unspecified sensorineural hearing loss: Secondary | ICD-10-CM | POA: Diagnosis not present

## 2022-11-05 ENCOUNTER — Other Ambulatory Visit: Payer: Self-pay

## 2022-11-05 MED ORDER — HYDROCHLOROTHIAZIDE 12.5 MG PO CAPS: 12.5000 mg | ORAL_CAPSULE | Freq: Every day | ORAL | 3 refills | Status: DC

## 2022-11-06 DIAGNOSIS — H43822 Vitreomacular adhesion, left eye: Secondary | ICD-10-CM | POA: Diagnosis not present

## 2022-11-06 DIAGNOSIS — H43812 Vitreous degeneration, left eye: Secondary | ICD-10-CM | POA: Diagnosis not present

## 2022-11-06 DIAGNOSIS — H353122 Nonexudative age-related macular degeneration, left eye, intermediate dry stage: Secondary | ICD-10-CM | POA: Diagnosis not present

## 2022-11-06 DIAGNOSIS — H353213 Exudative age-related macular degeneration, right eye, with inactive scar: Secondary | ICD-10-CM | POA: Diagnosis not present

## 2022-11-06 DIAGNOSIS — H04123 Dry eye syndrome of bilateral lacrimal glands: Secondary | ICD-10-CM | POA: Diagnosis not present

## 2022-11-07 ENCOUNTER — Ambulatory Visit (HOSPITAL_COMMUNITY)
Admission: RE | Admit: 2022-11-07 | Discharge: 2022-11-07 | Disposition: A | Discharge status: Home / Self Care | Payer: Medicare Other | Source: Ambulatory Visit | Attending: Cardiology | Admitting: Cardiology

## 2022-11-07 DIAGNOSIS — I2510 Atherosclerotic heart disease of native coronary artery without angina pectoris: Secondary | ICD-10-CM | POA: Insufficient documentation

## 2022-11-07 DIAGNOSIS — E785 Hyperlipidemia, unspecified: Secondary | ICD-10-CM | POA: Diagnosis not present

## 2022-11-07 DIAGNOSIS — I10 Essential (primary) hypertension: Secondary | ICD-10-CM | POA: Insufficient documentation

## 2022-11-07 DIAGNOSIS — R0989 Other specified symptoms and signs involving the circulatory and respiratory systems: Secondary | ICD-10-CM | POA: Diagnosis not present

## 2022-11-08 ENCOUNTER — Other Ambulatory Visit: Payer: Self-pay

## 2022-11-08 MED ORDER — ROSUVASTATIN CALCIUM 40 MG PO TABS: 40.0000 mg | ORAL_TABLET | Freq: Every day | ORAL | 3 refills | Status: DC

## 2022-12-04 IMAGING — CR DG SHOULDER 2+V*R*
3 series · 3 of 3 positions shown · non-contrast
Comparison: None.

CLINICAL DATA: Chronic right shoulder pain.

EXAM:
RIGHT SHOULDER - 2+ VIEW

[w shoulder grashey right]
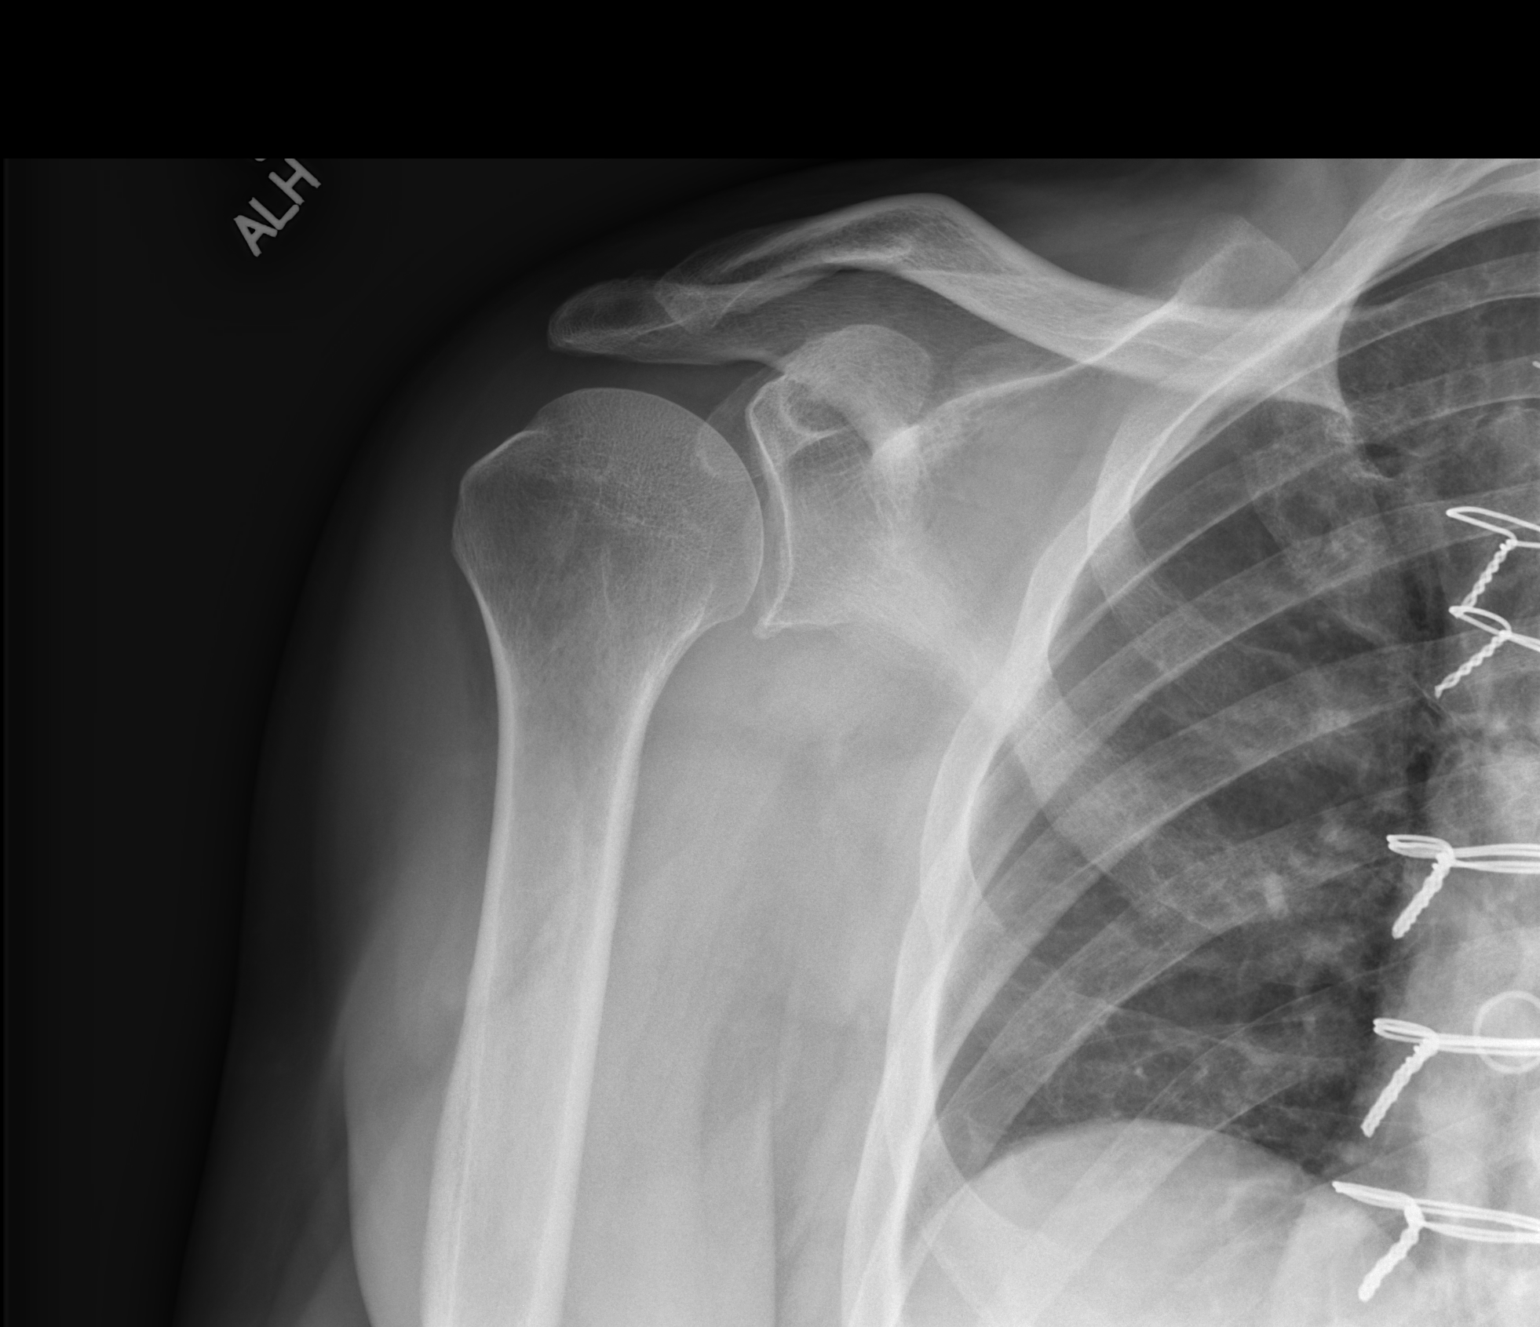

[w shoulder y-view right]
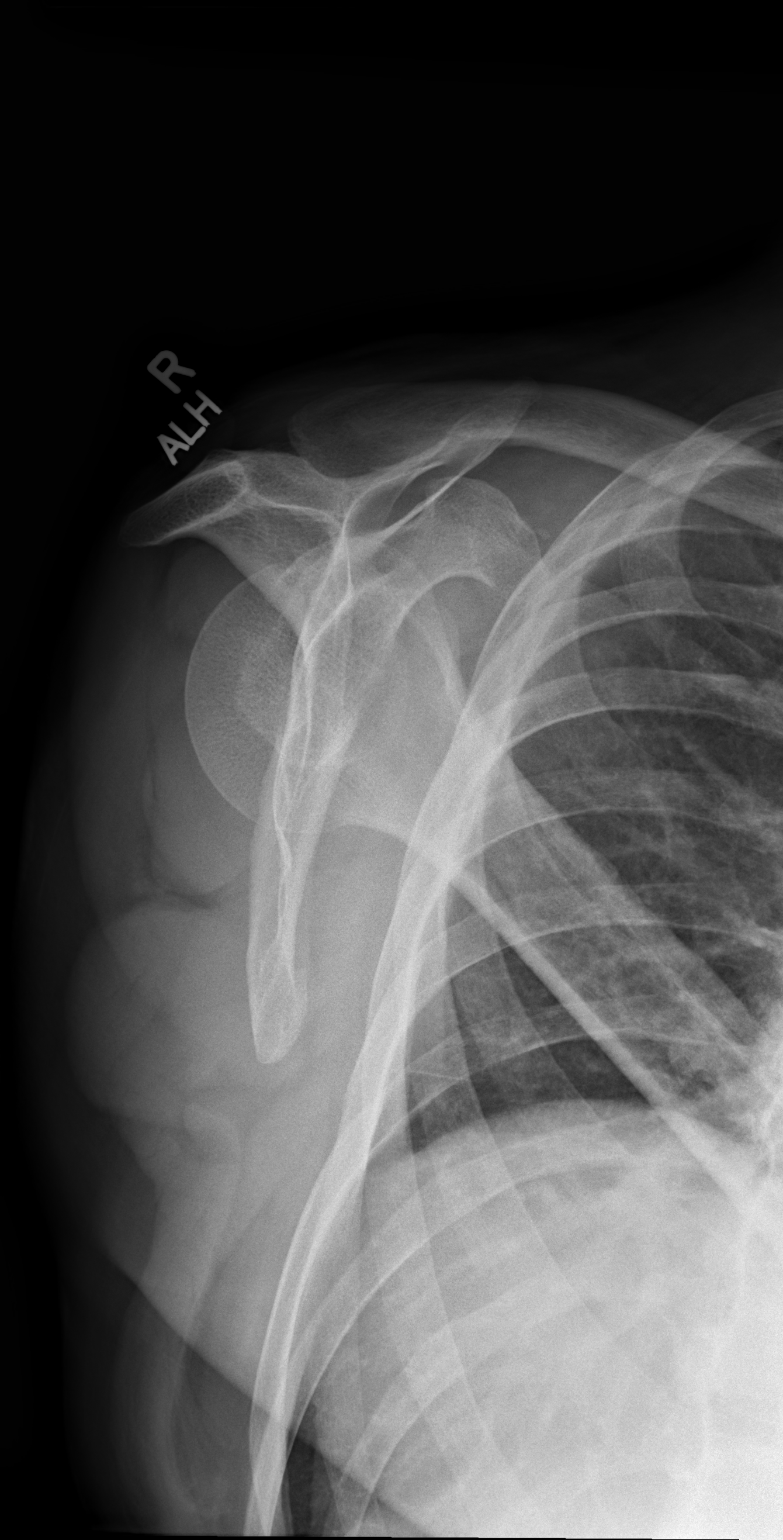

[x shoulder axillary right]
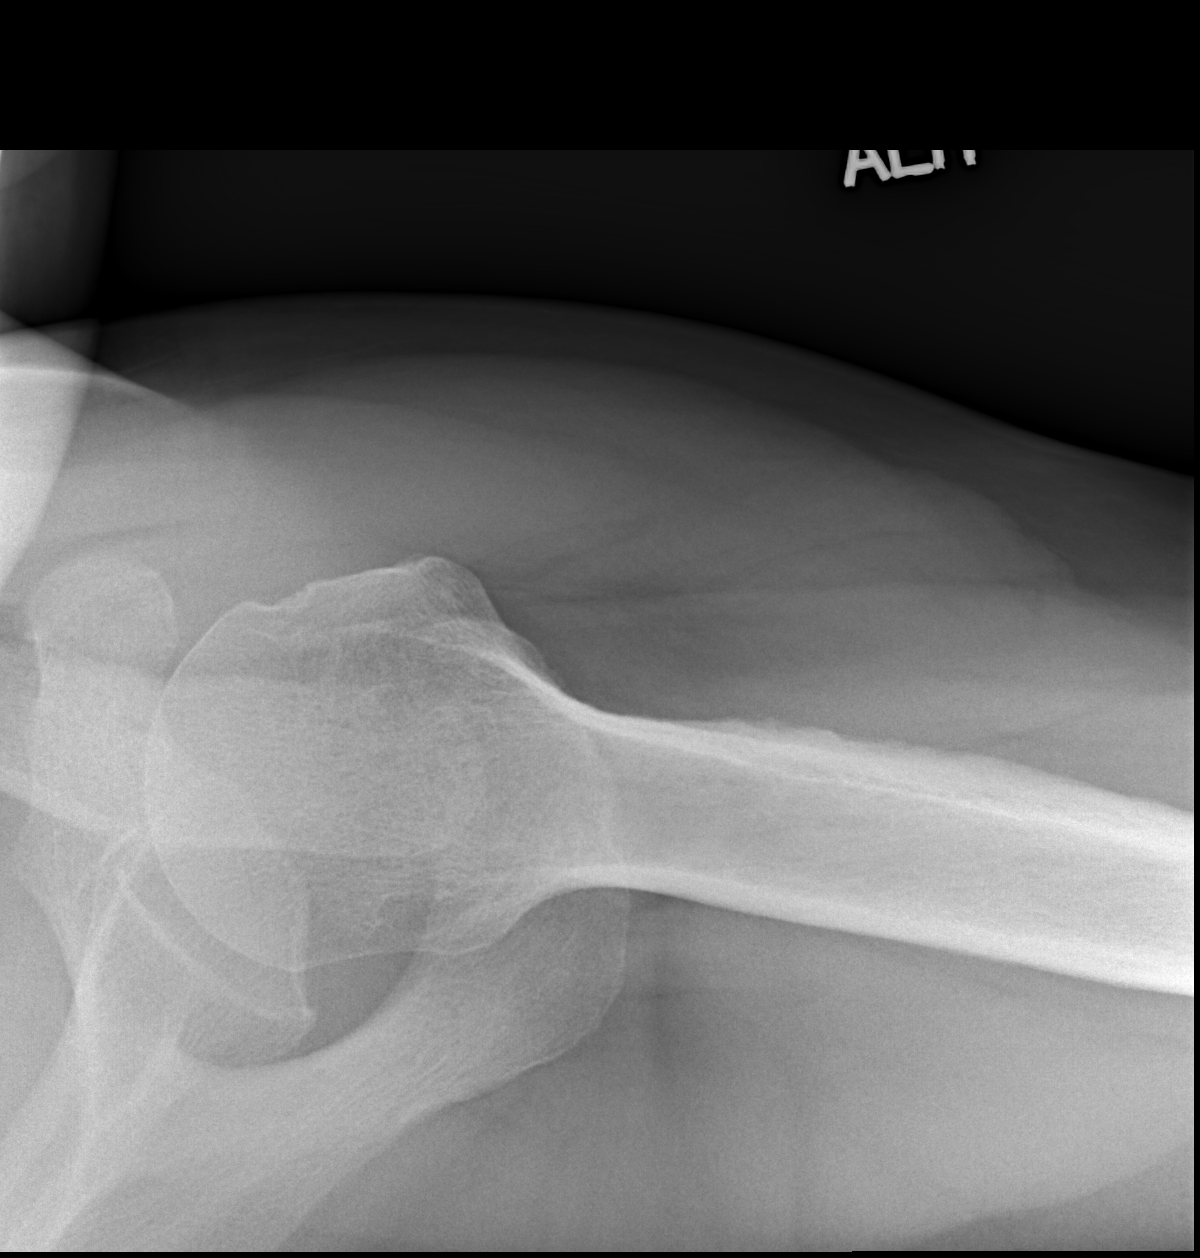

[3 of 3 positions shown; findings below may reference images not displayed]

FINDINGS: Mild inferior glenoid degenerative osteophytosis. Mild
acromioclavicular joint space narrowing. No acute fracture or
dislocation. Status post median sternotomy and CABG, partially
visualized.
IMPRESSION: Mild glenohumeral and acromioclavicular osteoarthritis.

## 2023-01-07 ENCOUNTER — Ambulatory Visit: Payer: Medicare Other | Attending: Nurse Practitioner

## 2023-01-07 DIAGNOSIS — E785 Hyperlipidemia, unspecified: Secondary | ICD-10-CM | POA: Diagnosis not present

## 2023-01-07 LAB — LIPID PANEL
Chol/HDL Ratio: 2.6 ratio (ref 0.0–5.0)
Cholesterol, Total: 115 mg/dL (ref 100–199)
HDL: 45 mg/dL (ref >39)
LDL Chol Calc (NIH): 53 mg/dL (ref 0–99)
Triglycerides: 87 mg/dL (ref 0–149)
VLDL Cholesterol Cal: 17 mg/dL (ref 5–40)

## 2023-01-07 LAB — ALT: ALT: 26 IU/L (ref 0–44)

## 2023-01-21 DIAGNOSIS — Z1283 Encounter for screening for malignant neoplasm of skin: Secondary | ICD-10-CM | POA: Diagnosis not present

## 2023-01-21 DIAGNOSIS — D1801 Hemangioma of skin and subcutaneous tissue: Secondary | ICD-10-CM | POA: Diagnosis not present

## 2023-01-21 DIAGNOSIS — D692 Other nonthrombocytopenic purpura: Secondary | ICD-10-CM | POA: Diagnosis not present

## 2023-01-21 DIAGNOSIS — L821 Other seborrheic keratosis: Secondary | ICD-10-CM | POA: Diagnosis not present

## 2023-01-21 DIAGNOSIS — H353122 Nonexudative age-related macular degeneration, left eye, intermediate dry stage: Secondary | ICD-10-CM | POA: Diagnosis not present

## 2023-01-21 DIAGNOSIS — L814 Other melanin hyperpigmentation: Secondary | ICD-10-CM | POA: Diagnosis not present

## 2023-01-21 DIAGNOSIS — L570 Actinic keratosis: Secondary | ICD-10-CM | POA: Diagnosis not present

## 2023-01-21 DIAGNOSIS — D489 Neoplasm of uncertain behavior, unspecified: Secondary | ICD-10-CM | POA: Diagnosis not present

## 2023-02-08 MED ORDER — ROSUVASTATIN CALCIUM 40 MG PO TABS: 40.0000 mg | ORAL_TABLET | Freq: Every day | ORAL | 2 refills | Status: DC

## 2023-02-08 MED ORDER — METOPROLOL SUCCINATE ER 100 MG PO TB24: ORAL_TABLET | ORAL | 2 refills | Status: DC

## 2023-02-20 DIAGNOSIS — H353122 Nonexudative age-related macular degeneration, left eye, intermediate dry stage: Secondary | ICD-10-CM | POA: Diagnosis not present

## 2023-03-22 DIAGNOSIS — H353122 Nonexudative age-related macular degeneration, left eye, intermediate dry stage: Secondary | ICD-10-CM | POA: Diagnosis not present

## 2023-03-25 ENCOUNTER — Other Ambulatory Visit: Payer: Self-pay

## 2023-03-25 MED ORDER — LISINOPRIL 5 MG PO TABS: 5.0000 mg | ORAL_TABLET | Freq: Every day | ORAL | 1 refills | Status: DC

## 2023-04-21 DIAGNOSIS — H353122 Nonexudative age-related macular degeneration, left eye, intermediate dry stage: Secondary | ICD-10-CM | POA: Diagnosis not present

## 2023-05-13 DIAGNOSIS — H04123 Dry eye syndrome of bilateral lacrimal glands: Secondary | ICD-10-CM | POA: Diagnosis not present

## 2023-05-13 DIAGNOSIS — H353213 Exudative age-related macular degeneration, right eye, with inactive scar: Secondary | ICD-10-CM | POA: Diagnosis not present

## 2023-05-13 DIAGNOSIS — H353122 Nonexudative age-related macular degeneration, left eye, intermediate dry stage: Secondary | ICD-10-CM | POA: Diagnosis not present

## 2023-05-13 DIAGNOSIS — H43812 Vitreous degeneration, left eye: Secondary | ICD-10-CM | POA: Diagnosis not present

## 2023-05-13 DIAGNOSIS — H02054 Trichiasis without entropian left upper eyelid: Secondary | ICD-10-CM | POA: Diagnosis not present

## 2023-05-16 DIAGNOSIS — H02054 Trichiasis without entropian left upper eyelid: Secondary | ICD-10-CM | POA: Diagnosis not present

## 2023-05-16 DIAGNOSIS — H3530 Unspecified macular degeneration: Secondary | ICD-10-CM | POA: Diagnosis not present

## 2023-05-16 DIAGNOSIS — H02051 Trichiasis without entropian right upper eyelid: Secondary | ICD-10-CM | POA: Diagnosis not present

## 2023-05-16 DIAGNOSIS — H02831 Dermatochalasis of right upper eyelid: Secondary | ICD-10-CM | POA: Diagnosis not present

## 2023-05-16 DIAGNOSIS — H02834 Dermatochalasis of left upper eyelid: Secondary | ICD-10-CM | POA: Diagnosis not present

## 2023-05-21 DIAGNOSIS — H353122 Nonexudative age-related macular degeneration, left eye, intermediate dry stage: Secondary | ICD-10-CM | POA: Diagnosis not present

## 2023-05-23 DIAGNOSIS — H3530 Unspecified macular degeneration: Secondary | ICD-10-CM | POA: Diagnosis not present

## 2023-05-23 DIAGNOSIS — I252 Old myocardial infarction: Secondary | ICD-10-CM | POA: Diagnosis not present

## 2023-05-23 DIAGNOSIS — E7800 Pure hypercholesterolemia, unspecified: Secondary | ICD-10-CM | POA: Diagnosis not present

## 2023-05-23 DIAGNOSIS — Z0000 Encounter for general adult medical examination without abnormal findings: Secondary | ICD-10-CM | POA: Diagnosis not present

## 2023-05-23 DIAGNOSIS — I10 Essential (primary) hypertension: Secondary | ICD-10-CM | POA: Diagnosis not present

## 2023-05-23 DIAGNOSIS — I25119 Atherosclerotic heart disease of native coronary artery with unspecified angina pectoris: Secondary | ICD-10-CM | POA: Diagnosis not present

## 2023-06-20 DIAGNOSIS — H353122 Nonexudative age-related macular degeneration, left eye, intermediate dry stage: Secondary | ICD-10-CM | POA: Diagnosis not present

## 2023-07-18 ENCOUNTER — Encounter: Payer: Self-pay | Admitting: Cardiology

## 2023-07-18 ENCOUNTER — Ambulatory Visit: Payer: Medicare Other | Attending: Cardiology | Admitting: Cardiology

## 2023-07-18 VITALS — BP 126/84 | HR 107 | Ht 68.0 in | Wt 189.6 lb

## 2023-07-18 DIAGNOSIS — I10 Essential (primary) hypertension: Secondary | ICD-10-CM | POA: Diagnosis not present

## 2023-07-18 DIAGNOSIS — I2510 Atherosclerotic heart disease of native coronary artery without angina pectoris: Secondary | ICD-10-CM | POA: Diagnosis not present

## 2023-07-18 DIAGNOSIS — E785 Hyperlipidemia, unspecified: Secondary | ICD-10-CM | POA: Diagnosis not present

## 2023-07-18 DIAGNOSIS — Z951 Presence of aortocoronary bypass graft: Secondary | ICD-10-CM

## 2023-07-18 NOTE — Patient Instructions (Signed)
 Medication Instructions:  The current medical regimen is effective;  continue present plan and medications.  *If you need a refill on your cardiac medications before your next appointment, please call your pharmacy*  Follow-Up: At [facility], you and your health needs are our priority.  As part of our continuing mission to provide you with exceptional heart care, we have created designated Provider Care Teams.  These Care Teams include your primary Cardiologist (physician) and Advanced Practice Providers (APPs -  Physician Assistants and Nurse Practitioners) who all work together to provide you with the care you need, when you need it.  We recommend signing up for the patient portal called "MyChart".  Sign up information is provided on this After Visit Summary.  MyChart is used to connect with patients for Virtual Visits (Telemedicine).  Patients are able to view lab/test results, encounter notes, upcoming appointments, etc.  Non-urgent messages can be sent to your provider as well.   To learn more about what you can do with MyChart, go to [email or web address].    Your next appointment:   1 year(s)  Provider:   Eligha Bridegroom, NP

## 2023-07-18 NOTE — Progress Notes (Signed)
Cardiology Office Note:  .   Date:  07/18/2023  ID:  Eric Warren, DOB 12-04-1950, MRN [id number] PCP: Elias Else, MD  [facility] Providers Cardiologist:  Donato Schultz, MD     History of Present Illness: .   Eric Warren is a 73 y.o. male Discussed the use of AI scribe software for clinical note transcription with the patient, who gave verbal consent to proceed.  History of Present Illness   A 73 year old patient with a history of coronary artery disease, hyperlipidemia, and a former smoker, presented for a follow-up visit. The patient underwent coronary artery bypass grafting in March 2015 with LIMA to LAD, sequential SVG to diagonal and OM1. At the time of the bypass, the patient experienced symptoms of food getting stuck in the esophagus, causing chest discomfort, along with nausea and weakness. Blood pressure control has been an issue in the past but is currently well-managed.  The patient's ejection fraction was 65% with no wall motion abnormalities and mild aortic valve sclerosis without any stenosis, as per the room on 31/07/2021. An Alexa scan on 07/04/2021 showed no evidence of ischemia or infarction with normal LV function, indicating a low-risk study.  The patient is currently on aspirin, Zetia 10mg , Crestor 40mg , hydrochlorothiazide 12.5mg , lisinopril 5mg , and metoprolol XL 100mg . The LDL cholesterol was 53 on 52/84/1324. Hemoglobin A1c was 5.3, hemoglobin 17.5, creatinine 0.79, and potassium 4.1. A carotid duplex performed on 02/04/2023 showed only mild plaque of both carotid arteries.  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 He has been experiencing blurred vision at times, which comes and goes. The patient has been diagnosed with macular degeneration and is concerned about the impact of his medications on his vision. He has also been experiencing some pain in his left knee since around Thanksgiving, which has affected his exercise  routine. The patient has been trying to lose weight and has started doing stretches to improve his posture. He has also been considering taking magnesium supplements to help with his back pain.            Studies Reviewed: .        Results   LABS LDL cholesterol: 53 (01/07/2023) Hemoglobin A1c: 5.3 (01/07/2023) Hemoglobin: 17.5 (01/07/2023) Creatinine: 0.79 (01/07/2023) Potassium: 4.1 (01/07/2023)  RADIOLOGY Carotid duplex: Mild plaque of both carotid arteries (11/07/2022)  DIAGNOSTIC Echocardiogram: Ejection fraction of 65% with no wall motion abnormalities and mild aortic valve sclerosis without stenosis (07/21/2021) Myocardial perfusion scan: No evidence of ischemia or infarction with normal left ventricular function, low risk (07/21/2021)     Risk Assessment/Calculations:            Physical Exam:   VS:  BP 126/84   Pulse (!) 107   Ht 5\' 8"  (1.727 m)   Wt 189 lb 9.6 oz (86 kg)   SpO2 97%   BMI 28.83 kg/m    Wt Readings from Last 3 Encounters:  07/18/23 189 lb 9.6 oz (86 kg)  10/16/22 197 lb 3.2 oz (89.4 kg)  08/30/21 191 lb (86.6 kg)    GEN: Well nourished, well developed in no acute distress NECK: No JVD; No carotid bruits CARDIAC: RRR, no murmurs, no rubs, no gallops RESPIRATORY:  Clear to auscultation without rales, wheezing or rhonchi  ABDOMEN: Soft, non-tender, non-distended EXTREMITIES:  No edema; No deformity   ASSESSMENT AND PLAN: .    Assessment and Plan    Coronary Artery Disease (CAD) Follow-up for CAD status post  CABG in March 2015 with LIMA to LAD, sequential SVG to diagonal and OM1. Current medications include aspirin, Zetia 10 mg, Crestor 40 mg, hydrochlorothiazide 12.5 mg, lisinopril 5 mg, and metoprolol XL 100 mg. Recent echocardiogram on 08/01/2021 showed an ejection fraction of 65% with no wall motion abnormalities and mild aortic valve sclerosis without stenosis. Recent stress test showed no evidence of ischemia or infarction with normal  LV function. LDL cholesterol was 53 on 40/98/1191. Asymptomatic and adherent to medications. Discussed Zetia's role in blocking cholesterol absorption and its use with Crestor to achieve LDL goals. Emphasized Mediterranean diet and regular exercise. - Continue current medications - Follow Mediterranean diet - Encourage regular exercise - Schedule follow-up with Rafael Bihari in one year  Hyperlipidemia Well-controlled hyperlipidemia with current LDL at 53. Patient is on Crestor 40 mg and Zetia 10 mg. Discussed Zetia's role in blocking cholesterol absorption and its use with Crestor. - Continue Crestor 40 mg - Continue Zetia 10 mg - Monitor lipid levels annually  Hypertension Hypertension is currently well-controlled with hydrochlorothiazide 12.5 mg, lisinopril 5 mg, and metoprolol XL 100 mg. Blood pressure has been stable. - Continue current antihypertensive regimen - Monitor blood pressure regularly  Macular Degeneration Reports intermittent blurred vision, possibly related to dehydration and dry eyes. Managed with eye drops from the retina center. Discussed dehydration's impact on vision and the importance of adequate hydration. - Encourage adequate hydration - Continue using prescribed eye drops - Follow up with retina specialist as needed  General Health Maintenance Maintaining a healthy lifestyle with regular exercise and a Mediterranean diet. Discussed core exercises for posture and potential benefits of magnesium supplements for muscle cramps and flexibility. Aims for gradual weight loss of 10 pounds. - Encourage continued adherence to Mediterranean diet - Encourage regular exercise, including core strengthening exercises - Consider magnesium supplements for muscle cramps and flexibility - Monitor weight and aim for gradual weight loss of 10 pounds  Follow-up - Schedule follow-up with Eligha Bridegroom in one year               Signed, Donato Schultz, MD

## 2023-07-20 DIAGNOSIS — H353122 Nonexudative age-related macular degeneration, left eye, intermediate dry stage: Secondary | ICD-10-CM | POA: Diagnosis not present

## 2023-08-19 DIAGNOSIS — H353122 Nonexudative age-related macular degeneration, left eye, intermediate dry stage: Secondary | ICD-10-CM | POA: Diagnosis not present

## 2023-09-17 ENCOUNTER — Other Ambulatory Visit: Payer: Self-pay | Admitting: Nurse Practitioner

## 2023-09-18 DIAGNOSIS — H353122 Nonexudative age-related macular degeneration, left eye, intermediate dry stage: Secondary | ICD-10-CM | POA: Diagnosis not present

## 2023-10-09 ENCOUNTER — Other Ambulatory Visit: Payer: Self-pay | Admitting: Nurse Practitioner

## 2023-10-18 DIAGNOSIS — H353122 Nonexudative age-related macular degeneration, left eye, intermediate dry stage: Secondary | ICD-10-CM | POA: Diagnosis not present

## 2023-11-03 ENCOUNTER — Other Ambulatory Visit: Payer: Self-pay | Admitting: Nurse Practitioner

## 2023-11-11 DIAGNOSIS — H353122 Nonexudative age-related macular degeneration, left eye, intermediate dry stage: Secondary | ICD-10-CM | POA: Diagnosis not present

## 2023-11-11 DIAGNOSIS — H02054 Trichiasis without entropian left upper eyelid: Secondary | ICD-10-CM | POA: Diagnosis not present

## 2023-11-11 DIAGNOSIS — H43812 Vitreous degeneration, left eye: Secondary | ICD-10-CM | POA: Diagnosis not present

## 2023-11-11 DIAGNOSIS — H04123 Dry eye syndrome of bilateral lacrimal glands: Secondary | ICD-10-CM | POA: Diagnosis not present

## 2023-11-11 DIAGNOSIS — H353213 Exudative age-related macular degeneration, right eye, with inactive scar: Secondary | ICD-10-CM | POA: Diagnosis not present

## 2023-11-14 ENCOUNTER — Other Ambulatory Visit: Payer: Self-pay | Admitting: Nurse Practitioner

## 2023-11-17 DIAGNOSIS — H353122 Nonexudative age-related macular degeneration, left eye, intermediate dry stage: Secondary | ICD-10-CM | POA: Diagnosis not present

## 2023-12-17 DIAGNOSIS — H353122 Nonexudative age-related macular degeneration, left eye, intermediate dry stage: Secondary | ICD-10-CM | POA: Diagnosis not present

## 2024-01-16 DIAGNOSIS — H353122 Nonexudative age-related macular degeneration, left eye, intermediate dry stage: Secondary | ICD-10-CM | POA: Diagnosis not present

## 2024-01-20 ENCOUNTER — Other Ambulatory Visit: Payer: Self-pay

## 2024-01-20 MED ORDER — HYDROCHLOROTHIAZIDE 12.5 MG PO CAPS: 12.5000 mg | ORAL_CAPSULE | Freq: Every day | ORAL | 1 refills | Status: DC

## 2024-01-23 DIAGNOSIS — L578 Other skin changes due to chronic exposure to nonionizing radiation: Secondary | ICD-10-CM | POA: Diagnosis not present

## 2024-01-23 DIAGNOSIS — D1801 Hemangioma of skin and subcutaneous tissue: Secondary | ICD-10-CM | POA: Diagnosis not present

## 2024-01-23 DIAGNOSIS — Z85828 Personal history of other malignant neoplasm of skin: Secondary | ICD-10-CM | POA: Diagnosis not present

## 2024-01-23 DIAGNOSIS — L821 Other seborrheic keratosis: Secondary | ICD-10-CM | POA: Diagnosis not present

## 2024-01-23 DIAGNOSIS — D225 Melanocytic nevi of trunk: Secondary | ICD-10-CM | POA: Diagnosis not present

## 2024-01-23 DIAGNOSIS — D489 Neoplasm of uncertain behavior, unspecified: Secondary | ICD-10-CM | POA: Diagnosis not present

## 2024-01-23 DIAGNOSIS — Z85820 Personal history of malignant melanoma of skin: Secondary | ICD-10-CM | POA: Diagnosis not present

## 2024-01-23 DIAGNOSIS — L814 Other melanin hyperpigmentation: Secondary | ICD-10-CM | POA: Diagnosis not present

## 2024-02-15 DIAGNOSIS — H353122 Nonexudative age-related macular degeneration, left eye, intermediate dry stage: Secondary | ICD-10-CM | POA: Diagnosis not present

## 2024-03-16 DIAGNOSIS — H353122 Nonexudative age-related macular degeneration, left eye, intermediate dry stage: Secondary | ICD-10-CM | POA: Diagnosis not present

## 2024-04-15 DIAGNOSIS — H353122 Nonexudative age-related macular degeneration, left eye, intermediate dry stage: Secondary | ICD-10-CM | POA: Diagnosis not present

## 2024-05-14 ENCOUNTER — Other Ambulatory Visit: Payer: Self-pay | Admitting: Cardiology

## 2024-07-14 ENCOUNTER — Other Ambulatory Visit: Payer: Self-pay | Admitting: Cardiology

## 2024-07-17 ENCOUNTER — Encounter: Payer: Self-pay | Admitting: Cardiology

## 2024-07-23 ENCOUNTER — Encounter: Payer: Self-pay | Admitting: Cardiology

## 2024-07-23 ENCOUNTER — Ambulatory Visit: Attending: Cardiology | Admitting: Cardiology

## 2024-07-23 VITALS — BP 120/64 | HR 52 | Ht 68.0 in | Wt 191.0 lb

## 2024-07-23 DIAGNOSIS — I2510 Atherosclerotic heart disease of native coronary artery without angina pectoris: Secondary | ICD-10-CM

## 2024-07-23 DIAGNOSIS — Z951 Presence of aortocoronary bypass graft: Secondary | ICD-10-CM

## 2024-07-23 DIAGNOSIS — H353220 Exudative age-related macular degeneration, left eye, stage unspecified: Secondary | ICD-10-CM

## 2024-07-23 DIAGNOSIS — I10 Essential (primary) hypertension: Secondary | ICD-10-CM

## 2024-07-23 NOTE — Progress Notes (Signed)
 " Cardiology Office Note:  .   Date:  07/23/2024  ID:  Eric Warren, DOB 04-01-1951, MRN [id number] PCP: Gib Charleston, MD  [facility] Providers Cardiologist:  Oneil Parchment, MD     History of Present Illness: .   Eric Warren is a 74 y.o. male Discussed the use of AI scribe   History of Present Illness Eric Warren is a 74 year old male with coronary artery disease and hyperlipidemia who presents for routine follow-up after CABG in 2015.  Coronary artery disease and post-cabg status - Underwent coronary artery bypass grafting (CABG) in March 2015 with LIMA to LAD, SVG to SOM1, and diagonal. - No recurrence of preoperative symptoms of esophageal food impaction or chest discomfort since surgery. - No recurrence of atypical symptoms associated with prior myocardial infarction since bypass surgery.  Exertional dyspnea - [place] - No shortness of breath on flat surfaces. - Remains active with daily activities including feeding ducks and using a fire pit in the backyard.  He is agreeable to tour.  Has an e-bike goes up the incline to get out of the neighborhood to go pick up his medicine for instance.  Hydration and renal function - Concern regarding hydration due to previous advice to increase water intake for dehydration affecting kidney function.  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      ROS: No chest pain  Studies Reviewed: SABRA   EKG Interpretation Date/Time:  Thursday July 23 2024 10:03:28 EST Ventricular Rate:  52 PR Interval:  134 QRS Duration:  98 QT Interval:  466 QTC Calculation: 433 R Axis:   27  Text Interpretation: Sinus bradycardia Nonspecific ST abnormality When compared with ECG of 26-Sep-2013 07:06, Vent. rate has decreased BY  39 BPM ST no longer elevated in Anterolateral leads Confirmed by Parchment Oneil  [phone]) on 07/23/2024 10:08:02 AM    Results Labs LDL: 53  Diagnostic Nuclear stress test (2023): Normal Ejection fraction (2023): 65% Risk Assessment/Calculations:            Physical Exam:   VS:  BP 120/64 (BP Location: Right Arm, Patient Position: Sitting, Cuff Size: Large)   Pulse (!) 52   Ht 5' 8 (1.727 m)   Wt 191 lb (86.6 kg)   SpO2 96%   BMI 29.04 kg/m    Wt Readings from Last 3 Encounters:  07/23/24 191 lb (86.6 kg)  07/18/23 189 lb 9.6 oz (86 kg)  10/16/22 197 lb 3.2 oz (89.4 kg)    GEN: Well nourished, well developed in no acute distress NECK: No JVD; No carotid bruits CARDIAC: CABG scar RRR, no murmurs, no rubs, no gallops RESPIRATORY:  Clear to auscultation without rales, wheezing or rhonchi  ABDOMEN: Soft, non-tender, non-distended EXTREMITIES:  No edema; No deformity   ASSESSMENT AND PLAN: .    Assessment and Plan Assessment & Plan Coronary artery disease, status post-CABG Status post-CABG in March 2015 with LIMA to LAD and SVG to SOM1 and diagonal. No current symptoms of chest discomfort or esophageal sensation. Normal nuclear stress test in 2023 with low risk. Ejection fraction is 65%. Reports exertional dyspnea on inclines, likely due to conditioning rather than cardiac issues. No indication for further cardiac intervention at this time. - Continue current medications: Aspirin  81 mg, Zetia  10 mg, Crestor  40 mg, Hydrochlorothiazide  12.5 mg, Lisinopril  5 mg, Metoprolol  XL 100 mg. - Encouraged regular  physical activity, including use of e-bike for exercise. - Advised to report any new or worsening symptoms, such as increased chest discomfort or significant changes in exertional tolerance.  Hyperlipidemia Well-controlled with current medication regimen. LDL cholesterol was 53 in the past, indicating effective management. - Continue Crestor  40 mg daily and Zetia  10 mg daily.  Hypertension Well-controlled with current medication regimen. Blood pressure is  excellent today. - Continue current antihypertensive medications: Hydrochlorothiazide  12.5 mg, Lisinopril  5 mg, Metoprolol  XL 100 mg. - Encouraged adequate hydration, aiming for 1.5 liters of water per day.  Macular degeneration - Currently on Ocuvite.  Followed by ophthalmology.       Dispo: 1 yr  Signed, Oneil Parchment, MD  "

## 2024-07-23 NOTE — Patient Instructions (Signed)

## 2024-07-31 ENCOUNTER — Other Ambulatory Visit: Payer: Self-pay | Admitting: Cardiology
